# Patient Record
Sex: Female | Born: 1995 | Hispanic: Yes | Marital: Single | Smoking: Former smoker
Health system: Southern US, Community
[De-identification: ages and names within clinical notes are randomized; demographics above are authoritative.]

## PROBLEM LIST (undated history)

## (undated) ENCOUNTER — Inpatient Hospital Stay (HOSPITAL_COMMUNITY): Payer: Self-pay

## (undated) DIAGNOSIS — F419 Anxiety disorder, unspecified: Secondary | ICD-10-CM

## (undated) DIAGNOSIS — B977 Papillomavirus as the cause of diseases classified elsewhere: Secondary | ICD-10-CM

## (undated) DIAGNOSIS — R87629 Unspecified abnormal cytological findings in specimens from vagina: Secondary | ICD-10-CM

## (undated) DIAGNOSIS — O139 Gestational [pregnancy-induced] hypertension without significant proteinuria, unspecified trimester: Secondary | ICD-10-CM

## (undated) HISTORY — PX: NO PAST SURGERIES: SHX2092

## (undated) HISTORY — DX: Gestational (pregnancy-induced) hypertension without significant proteinuria, unspecified trimester: O13.9

## (undated) HISTORY — DX: Unspecified abnormal cytological findings in specimens from vagina: R87.629

---

## 2017-02-21 DIAGNOSIS — B977 Papillomavirus as the cause of diseases classified elsewhere: Secondary | ICD-10-CM

## 2017-02-21 HISTORY — DX: Papillomavirus as the cause of diseases classified elsewhere: B97.7

## 2018-02-21 NOTE — L&D Delivery Note (Addendum)
Patient: Carla Reyes MRN: [id number]  GBS status: negative, IAP given None  Patient is a 23 y.o. now G1P1 s/p NSVD at [redacted]w[redacted]d, who was admitted for IOL 2/2 to [place]. AROM 2h 34m prior to delivery with clear fluid.   Delivery Note At 10:40 PM a viable and healthy female was delivered via Vaginal, Spontaneous (Presentation: ROA; vertex).  APGAR: 9, 9; weight  pending.   Placenta status: spontaneous, intact.  3 vessel cord. With no complications.   Head delivered ROA. On e nuchal cord present. Shoulder and body delivered in usual fashion. Infant with spontaneous cry, placed on mother's abdomen, dried and bulb suctioned. Cord clamped x 2 after 1-minute delay, and cut by family member. Cord blood drawn. Placenta delivered spontaneously with gentle cord traction. Fundus firm with massage and Pitocin. Perineum inspected and found to have superficial perilabial abrasions, which were found to be hemostatic.  Anesthesia:   Episiotomy: None Lacerations: None Suture Repair: None Est. Blood Loss (mL): 300  Mom to postpartum.  Baby to Couplet care / Skin to Skin.  [place] 12/04/2018, 11:07 PM   Attestation:  I confirm that I have verified the information documented in the resident's note and that I have also personally reperformed the physical exam and all medical decision making activities.   I was gloved and present for entire delivery SVD without incident No difficulty with shoulders No lacerations, but there were periurethral abrasions  Seabron Spates, CNM   Please schedule this patient for Postpartum visit in: 4 weeks with the following provider: Any provider For C/S patients schedule nurse incision check in weeks 2 weeks: no High risk pregnancy complicated by: HTN Delivery mode:  SVD Anticipated Birth Control:  IUD  Wants it placed at [facility] visit PP Procedures needed: BP check  1-2 weeks Schedule Integrated [place] visit: no

## 2018-05-04 ENCOUNTER — Other Ambulatory Visit: Payer: Self-pay

## 2018-05-04 ENCOUNTER — Encounter (HOSPITAL_COMMUNITY): Payer: Self-pay | Admitting: *Deleted

## 2018-05-04 ENCOUNTER — Inpatient Hospital Stay (HOSPITAL_COMMUNITY): Payer: Self-pay

## 2018-05-04 ENCOUNTER — Inpatient Hospital Stay (HOSPITAL_COMMUNITY)
Admission: AD | Admit: 2018-05-04 | Discharge: 2018-05-04 | Disposition: A | Discharge status: Home / Self Care | Payer: Self-pay | Attending: Obstetrics and Gynecology | Admitting: Obstetrics and Gynecology

## 2018-05-04 DIAGNOSIS — Z3490 Encounter for supervision of normal pregnancy, unspecified, unspecified trimester: Secondary | ICD-10-CM

## 2018-05-04 DIAGNOSIS — Z3A01 Less than 8 weeks gestation of pregnancy: Secondary | ICD-10-CM

## 2018-05-04 DIAGNOSIS — O26891 Other specified pregnancy related conditions, first trimester: Secondary | ICD-10-CM

## 2018-05-04 DIAGNOSIS — Z87891 Personal history of nicotine dependence: Secondary | ICD-10-CM | POA: Insufficient documentation

## 2018-05-04 DIAGNOSIS — R102 Pelvic and perineal pain: Secondary | ICD-10-CM

## 2018-05-04 DIAGNOSIS — Z3A08 8 weeks gestation of pregnancy: Secondary | ICD-10-CM | POA: Insufficient documentation

## 2018-05-04 HISTORY — DX: Anxiety disorder, unspecified: F41.9

## 2018-05-04 HISTORY — DX: Papillomavirus as the cause of diseases classified elsewhere: B97.7

## 2018-05-04 LAB — CBC
HCT: 39.2 % (ref 36.0–46.0)
HEMOGLOBIN: 12.9 g/dL (ref 12.0–15.0)
MCH: 28.8 pg (ref 26.0–34.0)
MCHC: 32.9 g/dL (ref 30.0–36.0)
MCV: 87.5 fL (ref 80.0–100.0)
Platelets: 269 10*3/uL (ref 150–400)
RBC: 4.48 MIL/uL (ref 3.87–5.11)
RDW: 13.3 % (ref 11.5–15.5)
WBC: 7.5 10*3/uL (ref 4.0–10.5)
nRBC: 0 % (ref 0.0–0.2)

## 2018-05-04 LAB — URINALYSIS, ROUTINE W REFLEX MICROSCOPIC
BILIRUBIN URINE: NEGATIVE
Glucose, UA: NEGATIVE mg/dL
Hgb urine dipstick: NEGATIVE
Ketones, ur: NEGATIVE mg/dL
Leukocytes,Ua: NEGATIVE
Nitrite: NEGATIVE
Protein, ur: NEGATIVE mg/dL
Specific Gravity, Urine: 1.024 (ref 1.005–1.030)
pH: 5 (ref 5.0–8.0)

## 2018-05-04 LAB — ABO/RH: ABO/RH(D): O POS

## 2018-05-04 LAB — HCG, QUANTITATIVE, PREGNANCY: hCG, Beta Chain, Quant, S: 69722 m[IU]/mL — ABNORMAL HIGH (ref <5)

## 2018-05-04 LAB — POCT PREGNANCY, URINE: Preg Test, Ur: POSITIVE — AB

## 2018-05-04 NOTE — Discharge Instructions (Signed)
[facility]     Phone: [phone]  [facility] [place]  Phone: [phone]  [facility] [employer] [facility] [place]  Phone: [phone]  [facility] [employer] [facility] [place]                           Phone: [phone]  [facility] for [facility]          Phone: [phone]  [facility]    Phone: [phone]   [facility] [place])    Phone: [phone]  [facility]    Phone: [phone]  [facility]    Phone: [phone]  [facility]    Phone: [phone]  [facility]  Phone: [phone]  [facility]               Phone: [phone]  [facility]   Phone: [phone]  [facility]    Phone: [phone]                   Abdominal Pain During Pregnancy  Abdominal pain is common during pregnancy, and has many possible causes. Some causes are more serious than others, and sometimes the cause is not known. Abdominal pain can be a sign that labor is starting. It can also be caused by normal growth and stretching of muscles and ligaments during pregnancy. Always tell your health care provider if you have any abdominal pain. Follow these instructions at home:  Do not have sex or put anything in your vagina until your pain goes away completely.  Get plenty of rest until your pain improves.  Drink enough fluid to keep your urine pale yellow.  Take over-the-counter and prescription medicines only as told by your health care provider.  Keep all follow-up visits as told by your health care provider. This is important. Contact a health care provider if:  Your pain continues or gets worse after resting.  You have lower abdominal pain that: ? Comes and  goes at regular intervals. ? Spreads to your back. ? Is similar to menstrual cramps.  You have pain or burning when you urinate. Get help right away if:  You have a fever or chills.  You have vaginal bleeding.  You are leaking fluid from your vagina.  You are passing tissue from your vagina.  You have vomiting or diarrhea that lasts for more than 24 hours.  Your baby is moving less than usual.  You feel very weak or faint.  You have shortness of breath.  You develop severe pain in your upper abdomen. Summary  Abdominal pain is common during pregnancy, and has many possible causes.  If you experience abdominal pain during pregnancy, tell your health care provider right away.  Follow your health care provider's home care instructions and keep all follow-up visits as directed. This information is not intended to replace advice given to you by your health care provider. Make sure you discuss any questions you have with your health care provider. Document Released: 02/07/2005 Document Revised: 05/12/2016 Document Reviewed: 05/12/2016 Elsevier Interactive Patient Education  2019 [employer].  Safe Medications in Pregnancy   Acne: Benzoyl Peroxide Salicylic Acid  Backache/Headache: Tylenol: 2 regular strength every 4 hours OR  2 Extra strength every 6 hours  Colds/Coughs/Allergies: Benadryl (alcohol free) 25 mg every 6 hours as needed Breath right strips Claritin Cepacol throat lozenges Chloraseptic throat spray Cold-Eeze- up to three times per day Cough drops, alcohol free Flonase (by prescription only) Guaifenesin Mucinex Robitussin DM (plain only, alcohol free) Saline nasal spray/drops Sudafed (pseudoephedrine) & Actifed ** use only after [redacted] weeks gestation and if you do not have high blood pressure Tylenol Vicks Vaporub Zinc lozenges Zyrtec   Constipation: Colace Ducolax suppositories Fleet enema Glycerin suppositories Metamucil Milk of  magnesia Miralax Senokot Smooth move tea  Diarrhea: Kaopectate Imodium A-D  *NO pepto Bismol  Hemorrhoids: Anusol Anusol HC Preparation H Tucks  Indigestion: Tums Maalox Mylanta Zantac  Pepcid  Insomnia: Benadryl (alcohol free) 25mg  every 6 hours as needed Tylenol PM Unisom, no Gelcaps  Leg Cramps: Tums MagGel  Nausea/Vomiting:  Bonine Dramamine Emetrol Ginger extract Sea bands Meclizine  Nausea medication to take during pregnancy:  Unisom (doxylamine succinate 25 mg tablets) Take one tablet daily at bedtime. If symptoms are not adequately controlled, the dose can be increased to a maximum recommended dose of two tablets daily (1/2 tablet in the morning, 1/2 tablet mid-afternoon and one at bedtime). Vitamin B6 100mg  tablets. Take one tablet twice a day (up to 200 mg per day).  Skin Rashes: Aveeno products Benadryl cream or 25mg  every 6 hours as needed Calamine Lotion 1% cortisone cream  Yeast infection: Gyne-lotrimin 7 Monistat 7   **If taking multiple medications, please check labels to avoid duplicating the same active ingredients **take medication as directed on the label ** Do not exceed 4000 mg of tylenol in 24 hours **Do not take medications that contain aspirin or ibuprofen

## 2018-05-04 NOTE — MAU Note (Addendum)
Presents stating she was seen @ [facility], had ultrasound Tuesday, March 11th.  Reports called today with U/S results, instructed to go to MAU for further testing d/t seeing "yolk sac & another sac".  Currently denies abdominal pain, but has had pain in the past.  Denies VB.

## 2018-05-04 NOTE — MAU Provider Note (Signed)
History     CSN: [id number]  Arrival date and time: 05/04/18 1218   None     Chief Complaint  Patient presents with  . Ectopic Pregnancy   Ms. Carla Reyes is a 23 y.o. G2P0010 at 6550w2d who presents to MAU for evaluation after she was seen at [facility]. Pt states she received a call from this office after receiving a free US there and was told that they "saw a yolk sac and another sac" of undetermined significance and instructed patient to come to MAU for evaluation. Pt denies VB so far this pregnancy. Pt reports mild pelvic pain/abdominal cramping intermittently for the last several weeks, but denies any pain today. Pt denies any other symptoms and reports the reason she is here today is she was instructed to come here by [facility].  Pt denies vaginal discharge/odor/itching. Pt denies N/V, abdominal pain, constipation, diarrhea, or urinary problems. Problems this pregnancy include: none. Allergies? NKDA Current medications/supplements? none Prenatal care provider? none  Boyfriend present for entire visit.    OB History    Gravida  2   Para      Term      Preterm      AB  1   Living        SAB  1   TAB      Ectopic      Multiple      Live Births              Past Medical History:  Diagnosis Date  . Anxiety   . HPV in female 2019    Past Surgical History:  Procedure Laterality Date  . NO PAST SURGERIES      History reviewed. No pertinent family history.  Social History   Tobacco Use  . Smoking status: Former Smoker    Types: Cigarettes  . Smokeless tobacco: Never Used  Substance Use Topics  . Alcohol use: Not Currently  . Drug use: Not Currently    Allergies: No Known Allergies  No medications prior to admission.    Review of Systems  Constitutional: Negative for chills, diaphoresis, fatigue and fever.  Gastrointestinal: Negative for abdominal pain, constipation, diarrhea, nausea and vomiting.   Genitourinary: Negative for difficulty urinating, dysuria, frequency, pelvic pain, urgency, vaginal bleeding and vaginal discharge.  Neurological: Negative for dizziness, weakness, light-headedness and headaches.   Physical Exam   Blood pressure 126/86, pulse 79, temperature 98.5 F (36.9 C), temperature source Oral, resp. rate 19, height 5\' 3"  (1.6 m), weight 82.3 kg, last menstrual period 03/07/2018, SpO2 97 %.  Physical Exam  Constitutional: She is oriented to person, place, and time. She appears well-developed and well-nourished. No distress.  HENT:  Head: Normocephalic.  Respiratory: Effort normal.  GI: Soft. She exhibits no distension and no mass. There is no abdominal tenderness. There is no rebound and no guarding.  Genitourinary:    Vagina and uterus normal.  Cervix exhibits no motion tenderness, no discharge and no friability. Right adnexum displays no mass, no tenderness and no fullness. Left adnexum displays no mass, no tenderness and no fullness.    No vaginal discharge.   Neurological: She is alert and oriented to person, place, and time.  Skin: Skin is warm and dry. She is not diaphoretic.  Psychiatric: She has a normal mood and affect. Her behavior is normal.   Results for orders placed or performed during the hospital encounter of 05/04/18 (from the past 24 hour(s))  Pregnancy,  urine POC     Status: Abnormal   Collection Time: 05/04/18 12:38 PM  Result Value Ref Range   Preg Test, Ur POSITIVE (A) NEGATIVE  Urinalysis, Routine w reflex microscopic     Status: Abnormal   Collection Time: 05/04/18 12:42 PM  Result Value Ref Range   Color, Urine YELLOW YELLOW   APPearance HAZY (A) CLEAR   Specific Gravity, Urine 1.024 1.005 - 1.030   pH 5.0 5.0 - 8.0   Glucose, UA NEGATIVE NEGATIVE mg/dL   Hgb urine dipstick NEGATIVE NEGATIVE   Bilirubin Urine NEGATIVE NEGATIVE   Ketones, ur NEGATIVE NEGATIVE mg/dL   Protein, ur NEGATIVE NEGATIVE mg/dL   Nitrite NEGATIVE NEGATIVE    Leukocytes,Ua NEGATIVE NEGATIVE  CBC     Status: None   Collection Time: 05/04/18  1:18 PM  Result Value Ref Range   WBC 7.5 4.0 - 10.5 K/uL   RBC 4.48 3.87 - 5.11 MIL/uL   Hemoglobin 12.9 12.0 - 15.0 g/dL   HCT 29.5 62.1 - 30.8 %   MCV 87.5 80.0 - 100.0 fL   MCH 28.8 26.0 - 34.0 pg   MCHC 32.9 30.0 - 36.0 g/dL   RDW 65.7 84.6 - 96.2 %   Platelets 269 150 - 400 K/uL   nRBC 0.0 0.0 - 0.2 %  ABO/Rh     Status: None   Collection Time: 05/04/18  1:18 PM  Result Value Ref Range   ABO/RH(D) O POS    No rh immune globuloin      NOT A RH IMMUNE GLOBULIN CANDIDATE, PT RH POSITIVE Performed at [facility], [address], [place], [place] [address]    US Ob Comp Less 14 Wks  Result Date: 05/04/2018 CLINICAL DATA:  Pelvic pain EXAM: OBSTETRIC <14 WK [place] AND TRANSVAGINAL OB US TECHNIQUE: Both transabdominal and transvaginal ultrasound examinations were performed for complete evaluation of the gestation as well as the maternal uterus, adnexal regions, and pelvic cul-de-sac. Transvaginal technique was performed to assess early pregnancy. COMPARISON:  None. FINDINGS: Intrauterine gestational sac: Visualized Yolk sac:  Visualized Embryo:  Visualized Cardiac Activity: Visualized Heart Rate: 166 bpm CRL:  15 mm   7 w   5 d                  [place] EDC: December 16, 2018 Subchorionic hemorrhage: There is a subchorionic hemorrhage measuring 1.5 x 0.5 x 0.7 cm. Maternal uterus/adnexae: Cervical os is closed. The right ovary measures 3.4 x 2.3 x 2.5 cm. Left ovary measures 2.2 x 1.5 x 2.2 cm. Small corpus luteum noted on the right. No other extrauterine pelvic or adnexal mass. No free pelvic fluid. IMPRESSION: Single live intrauterine gestation with estimated gestational age of 8-weeks. Subchorionic hemorrhage measuring 1.5 x 0.5 x 0.7 cm. No extrauterine pelvic mass beyond physiologic corpus luteum on the right. No free pelvic fluid. Electronically Signed   By: Bretta Bang III M.D.   On: 05/04/2018  13:46    MAU Course  Procedures  MDM -pt here for ectopic evaluation after finding of unknown significance found on [place] at [facility] -UA: hazy, otherwise WNL -hCG: pending at time of discharge -CBC: WNL -ABO: O positive -[place]: single IUP, [redacted]w[redacted]d, +FHB, sm. subchorionic hemorrhage, sm. corpus luteal cyst -pt discharged to home in stable condition with boyfriend and list of area OB providers  Orders Placed This Encounter  Procedures  . US OB Comp Less 14 Wks    Standing Status:  Standing    Number of Occurrences:   1    Order Specific Question:   Symptom/Reason for Exam    Answer:   Pelvic pain [[id number]]  . Urinalysis, Routine w reflex microscopic    Standing Status:   Standing    Number of Occurrences:   1  . CBC    Standing Status:   Standing    Number of Occurrences:   1  . hCG, quantitative, pregnancy    Standing Status:   Standing    Number of Occurrences:   1  . Pregnancy, urine POC    Standing Status:   Standing    Number of Occurrences:   1  . ABO/Rh    Standing Status:   Standing    Number of Occurrences:   1  . Discharge patient    Order Specific Question:   Discharge disposition    Answer:   01-Home or Self Care [1]    Order Specific Question:   Discharge patient date    Answer:   05/04/2018   No orders of the defined types were placed in this encounter.    Assessment and Plan   1. [redacted] weeks gestation of pregnancy   2. Pelvic pain   3. Intrauterine pregnancy    Allergies as of 05/04/2018   No Known Allergies     Medication List    You have not been prescribed any medications.     -begin taking PNVs -list of area OB providers given, please call to arrange new OB appt in 1week -pt discharged to home in stable condition with boyfriend  Odie Sera  05/04/2018, 2:18 PM

## 2018-06-14 ENCOUNTER — Other Ambulatory Visit (HOSPITAL_COMMUNITY)
Admission: RE | Admit: 2018-06-14 | Discharge: 2018-06-14 | Disposition: A | Discharge status: Home / Self Care | Payer: Medicaid Other | Source: Ambulatory Visit | Attending: Family Medicine | Admitting: Family Medicine

## 2018-06-14 ENCOUNTER — Other Ambulatory Visit: Payer: Self-pay

## 2018-06-14 ENCOUNTER — Encounter: Payer: Self-pay | Admitting: Family Medicine

## 2018-06-14 ENCOUNTER — Ambulatory Visit (INDEPENDENT_AMBULATORY_CARE_PROVIDER_SITE_OTHER): Payer: Medicaid Other | Admitting: Family Medicine

## 2018-06-14 DIAGNOSIS — Z3402 Encounter for supervision of normal first pregnancy, second trimester: Secondary | ICD-10-CM | POA: Diagnosis not present

## 2018-06-14 DIAGNOSIS — Z3400 Encounter for supervision of normal first pregnancy, unspecified trimester: Secondary | ICD-10-CM | POA: Insufficient documentation

## 2018-06-14 DIAGNOSIS — Z3A14 14 weeks gestation of pregnancy: Secondary | ICD-10-CM | POA: Diagnosis not present

## 2018-06-14 LAB — POCT URINALYSIS DIPSTICK OB
Bilirubin, UA: NEGATIVE
Blood, UA: NEGATIVE
Glucose, UA: NEGATIVE
Ketones, UA: NEGATIVE
Nitrite, UA: NEGATIVE
Spec Grav, UA: 1.015 (ref 1.010–1.025)
pH, UA: 6 (ref 5.0–8.0)

## 2018-06-14 NOTE — Progress Notes (Signed)
  Subjective:  Carla Reyes is a G2P0010 105w1d being seen today for her first obstetrical visit.  Her obstetrical history is significant for first pregnancy. FOB involved: boyfriend. Patient does intend to breast feed. Pregnancy history fully reviewed.  Patient reports no complaints.  BP 123/79   Pulse 89   Wt 183 lb 1.3 oz (83 kg)   LMP 03/07/2018   BMI 32.43 kg/m   HISTORY: OB History  Gravida Para Term Preterm AB Living  2       1    SAB TAB Ectopic Multiple Live Births  1            # Outcome Date GA Lbr Len/2nd Weight Sex Delivery Anes PTL Lv  2 Current           1 SAB             Past Medical History:  Diagnosis Date  . Anxiety   . HPV in female 2019    Past Surgical History:  Procedure Laterality Date  . NO PAST SURGERIES      Family History  Problem Relation Age of Onset  . Hypertension Father      Exam  BP 123/79   Pulse 89   Wt 183 lb 1.3 oz (83 kg)   LMP 03/07/2018   BMI 32.43 kg/m   CONSTITUTIONAL: Well-developed, well-nourished female in no acute distress.  HENT:  Normocephalic, atraumatic, External right and left ear normal. Oropharynx is clear and moist EYES: Conjunctivae and EOM are normal. Pupils are equal, round, and reactive to light. No scleral icterus.  NECK: Normal range of motion, supple, no masses.  Normal thyroid.  CARDIOVASCULAR: Normal heart rate noted, regular rhythm RESPIRATORY: Clear to auscultation bilaterally. Effort and breath sounds normal, no problems with respiration noted. BREASTS: Symmetric in size. No masses, skin changes, nipple drainage, or lymphadenopathy. ABDOMEN: Soft, normal bowel sounds, no distention noted.  No tenderness, rebound or guarding.  PELVIC: Normal appearing external genitalia; normal appearing vaginal mucosa and cervix. No abnormal discharge noted. Normal uterine size, no other palpable masses, no uterine or adnexal tenderness. MUSCULOSKELETAL: Normal range of motion. No tenderness.  No cyanosis,  clubbing, or edema.  2+ distal pulses. SKIN: Skin is warm and dry. No rash noted. Not diaphoretic. No erythema. No pallor. NEUROLOGIC: Alert and oriented to person, place, and time. Normal reflexes, muscle tone coordination. No cranial nerve deficit noted. PSYCHIATRIC: Normal mood and affect. Normal behavior. Normal judgment and thought content.    Assessment:    Pregnancy: G2P0010 Patient Active Problem List   Diagnosis Date Noted  . Supervision of normal first pregnancy, antepartum 06/14/2018      Plan:   1. Supervision of normal first pregnancy, antepartum FHT and FH normal.  Panorama discussed - desired. Schedule fetal survey [place]  - [facility] AMB BABYSCRIPTS SCHEDULE OPTIMIZATION - Culture, OB Urine - Hemoglobinopathy Evaluation - Obstetric Panel, Including HIV - Cytology - PAP( [place]) - Genetic Screening - US MFM OB COMP + 14 WK; Future - POC Urinalysis Dipstick OB     Problem list reviewed and updated. 75% of 30 min visit spent on counseling and coordination of care.     Levie Heritage 06/14/2018

## 2018-06-16 LAB — CULTURE, OB URINE

## 2018-06-16 LAB — URINE CULTURE, OB REFLEX

## 2018-06-18 LAB — OBSTETRIC PANEL, INCLUDING HIV
Antibody Screen: NEGATIVE
Basophils Absolute: 0 10*3/uL (ref 0.0–0.2)
Basos: 0 %
EOS (ABSOLUTE): 0.1 10*3/uL (ref 0.0–0.4)
Eos: 1 %
HIV Screen 4th Generation wRfx: NONREACTIVE
Hematocrit: 37.1 % (ref 34.0–46.6)
Hemoglobin: 12.9 g/dL (ref 11.1–15.9)
Hepatitis B Surface Ag: NEGATIVE
Immature Grans (Abs): 0.1 10*3/uL (ref 0.0–0.1)
Immature Granulocytes: 1 %
Lymphocytes Absolute: 2.5 10*3/uL (ref 0.7–3.1)
Lymphs: 25 %
MCH: 30 pg (ref 26.6–33.0)
MCHC: 34.8 g/dL (ref 31.5–35.7)
MCV: 86 fL (ref 79–97)
Monocytes Absolute: 0.5 10*3/uL (ref 0.1–0.9)
Monocytes: 5 %
Neutrophils Absolute: 7 10*3/uL (ref 1.4–7.0)
Neutrophils: 68 %
Platelets: 286 10*3/uL (ref 150–450)
RBC: 4.3 x10E6/uL (ref 3.77–5.28)
RDW: 13.6 % (ref 11.7–15.4)
RPR Ser Ql: NONREACTIVE
Rh Factor: POSITIVE
Rubella Antibodies, IGG: 2.58 index (ref >0.99)
WBC: 10.2 10*3/uL (ref 3.4–10.8)

## 2018-06-18 LAB — HEMOGLOBINOPATHY EVALUATION
Ferritin: 35 ng/mL (ref 15–150)
Hgb A2 Quant: 2.4 % (ref 1.8–3.2)
Hgb A: 97.6 % (ref 96.4–98.8)
Hgb C: 0 %
Hgb F Quant: 0 % (ref 0.0–2.0)
Hgb S: 0 %
Hgb Solubility: NEGATIVE
Hgb Variant: 0 %

## 2018-06-20 LAB — CYTOLOGY - PAP
Chlamydia: NEGATIVE
Diagnosis: NEGATIVE
Neisseria Gonorrhea: NEGATIVE

## 2018-06-21 ENCOUNTER — Telehealth: Payer: Self-pay

## 2018-06-21 NOTE — Telephone Encounter (Signed)
Pt called the office requesting Panorama results. Pt made aware that results have not resulted. Understanding was voiced. Jordynn Perrier l Chue Berkovich, CMA

## 2018-06-26 NOTE — Telephone Encounter (Signed)
Patient called and given Panorama results. Armandina Stammer RN

## 2018-07-19 ENCOUNTER — Ambulatory Visit (HOSPITAL_COMMUNITY)
Admission: RE | Admit: 2018-07-19 | Discharge: 2018-07-19 | Disposition: A | Discharge status: Home / Self Care | Payer: Medicaid Other | Source: Ambulatory Visit | Attending: Obstetrics and Gynecology | Admitting: Obstetrics and Gynecology

## 2018-07-19 ENCOUNTER — Other Ambulatory Visit: Payer: Self-pay

## 2018-07-19 ENCOUNTER — Other Ambulatory Visit: Payer: Self-pay | Admitting: Family Medicine

## 2018-07-19 ENCOUNTER — Other Ambulatory Visit (HOSPITAL_COMMUNITY): Payer: Self-pay | Admitting: *Deleted

## 2018-07-19 DIAGNOSIS — Z362 Encounter for other antenatal screening follow-up: Secondary | ICD-10-CM

## 2018-07-19 DIAGNOSIS — Z363 Encounter for antenatal screening for malformations: Secondary | ICD-10-CM | POA: Diagnosis not present

## 2018-07-19 DIAGNOSIS — Z3400 Encounter for supervision of normal first pregnancy, unspecified trimester: Secondary | ICD-10-CM

## 2018-07-19 DIAGNOSIS — O99212 Obesity complicating pregnancy, second trimester: Secondary | ICD-10-CM | POA: Diagnosis not present

## 2018-07-19 DIAGNOSIS — Z3A19 19 weeks gestation of pregnancy: Secondary | ICD-10-CM

## 2018-07-26 ENCOUNTER — Other Ambulatory Visit: Payer: Self-pay

## 2018-07-26 ENCOUNTER — Ambulatory Visit (INDEPENDENT_AMBULATORY_CARE_PROVIDER_SITE_OTHER): Payer: Medicaid Other | Admitting: Family Medicine

## 2018-07-26 DIAGNOSIS — Z3A20 20 weeks gestation of pregnancy: Secondary | ICD-10-CM

## 2018-07-26 DIAGNOSIS — Z3400 Encounter for supervision of normal first pregnancy, unspecified trimester: Secondary | ICD-10-CM

## 2018-07-26 DIAGNOSIS — Z3402 Encounter for supervision of normal first pregnancy, second trimester: Secondary | ICD-10-CM

## 2018-07-26 DIAGNOSIS — R102 Pelvic and perineal pain: Secondary | ICD-10-CM

## 2018-07-26 NOTE — Progress Notes (Signed)
TELEHEALTH OBSTETRICS PRENATAL VIRTUAL VIDEO VISIT ENCOUNTER NOTE  Provider location: [facility]   I connected with Carla Reyes on 07/26/18 at  2:00 PM EDT by WebEx Video Encounter at home and verified that I am speaking with the correct person using two identifiers.   I discussed the limitations, risks, security and privacy concerns of performing an evaluation and management service by telephone and the availability of in person appointments. I also discussed with the patient that there may be a patient responsible charge related to this service. The patient expressed understanding and agreed to proceed. Subjective:  Carla Reyes is a 23 y.o. G2P0010 at [redacted]w[redacted]d being seen today for ongoing prenatal care.  She is currently monitored for the following issues for this low-risk pregnancy and has Supervision of normal first pregnancy, antepartum on their problem list.  Patient reports occasional contractions.  Contractions: Not present. Vag. Bleeding: Small.  Movement: Present. Denies any leaking of fluid.   The following portions of the patient's history were reviewed and updated as appropriate: allergies, current medications, past family history, past medical history, past social history, past surgical history and problem list.   Objective:  There were no vitals filed for this visit.  Fetal Status:     Movement: Present     General:  Alert, oriented and cooperative. Patient is in no acute distress.  Respiratory: Normal respiratory effort, no problems with respiration noted  Mental Status: Normal mood and affect. Normal behavior. Normal judgment and thought content.  Rest of physical exam deferred due to type of encounter  Imaging: [place] Mfm Ob Detail +14 Wk  Result Date: 07/19/2018 ----------------------------------------------------------------------  OBSTETRICS REPORT                       (Signed Final 07/19/2018 09:42 pm)  ---------------------------------------------------------------------- Patient Info  ID #:       [id number]                          D.O.B.:  April 30, 1995 (23 yrs)  Name:       Carla Reyes                   Visit Date: 07/19/2018 12:15 pm ---------------------------------------------------------------------- Performed By  Performed By:     Eden Lathe BS      Ref. Address:      [address]                    [address]                                                              [address]  Attending:        Lin Landsman      [facility]  Referred By:  [facility] ---------------------------------------------------------------------- Orders   #  Description                          Code         Ordered By   1  [place] MFM OB DETAIL +14 WK              [id number]     Candelaria Celeste  ----------------------------------------------------------------------   #  Order #                    Accession #                 Episode #   1  [id number]                  [id number]                  [id number]  ---------------------------------------------------------------------- Indications   Antenatal screening for malformations          Z36.3   Obesity complicating pregnancy, second         O99.212   trimester (pregravid BMI 32)   [redacted] weeks gestation of pregnancy                Z3A.19   Low risk Panorama  ---------------------------------------------------------------------- Fetal Evaluation  Num Of Fetuses:          1  Fetal Heart Rate(bpm):   141  Cardiac Activity:        Observed  Presentation:            Cephalic  Placenta:                Posterior  P. Cord Insertion:       Visualized  Amniotic Fluid  AFI FV:      Within normal limits                              Largest Pocket(cm)                              5.67 ---------------------------------------------------------------------- Biometry  BPD:      45.9  mm     G. Age:  19w 6d          80  %    CI:        75.51   %    70 - 86                                                          FL/HC:       16.1  %    16.1 - 18.3  HC:      167.5  mm     G. Age:  19w 3d         56  %    HC/AC:       1.15       1.09 - 1.39  AC:      145.7  mm     G. Age:  19w 6d         70  %    FL/BPD:  58.8  %  FL:         27  mm     G. Age:  18w 1d         16  %    FL/AC:       18.5  %    20 - 24  HUM:      25.8  mm     G. Age:  18w 0d         22  %  CER:      19.2  mm     G. Age:  18w 4d         35  %  NFT:         3  mm  CM:          7  mm  Est. FW:     278   gm   0 lb 10 oz      46  % ---------------------------------------------------------------------- OB History  Gravidity:    2         Term:   0        Prem:   0        SAB:   1  TOP:          0       Ectopic:  0        Living: 0 ---------------------------------------------------------------------- Gestational Age  LMP:           19w 1d        Date:  03/07/18                 EDD:   12/12/18  U/S Today:     19w 2d                                        EDD:   12/11/18  Best:          19w 1d     Det. By:  LMP  (03/07/18)          EDD:   12/12/18 ---------------------------------------------------------------------- Anatomy  Cranium:               Appears normal         LVOT:                   Appears normal  Cavum:                 Not well visualized    Aortic Arch:            Not well visualized  Ventricles:            Appears normal         Ductal Arch:            Appears normal  Choroid Plexus:        Appears normal         Diaphragm:              Appears normal  Cerebellum:            Appears normal         Stomach:                Appears normal, left  sided  Posterior Fossa:       Appears normal         Abdomen:                Appears normal  Nuchal Fold:           Appears normal         Abdominal Wall:         Appears nml (cord                                                                         insert, abd wall)  Face:                  Appears normal         Cord Vessels:           Appears normal (3                         (orbits and profile)                           vessel cord)  Lips:                  Appears normal         Kidneys:                Appear normal  Palate:                Appears normal         Bladder:                Appears normal  Thoracic:              Appears normal         Spine:                  Not well visualized  Heart:                 Not well visualized    Upper Extremities:      Appears normal  RVOT:                  Appears normal         Lower Extremities:      Appears normal  Other:  Technically difficult due to maternal habitus and fetal position. ---------------------------------------------------------------------- Cervix Uterus Adnexa  Cervix  Length:            3.9  cm.  Normal appearance by transabdominal scan.  Uterus  No abnormality visualized.  Left Ovary  Within normal limits.  Right Ovary  Within normal limits.  Cul De Sac  No free fluid seen.  Adnexa  No abnormality visualized. ---------------------------------------------------------------------- Impression  Normal interval growth.  No ultrasonic evidence of structural  fetal anomalies.  Suboptimal views of the fetal anatomy obtained secondary to  fetal position. ---------------------------------------------------------------------- Recommendations  Follow up anatomy in 4 weeks. ----------------------------------------------------------------------               Lin Landsmanorenthian Booker, MD Electronically Signed Final Report   07/19/2018 09:42 pm ----------------------------------------------------------------------   Assessment  and Plan:  Pregnancy: G2P0010 at [redacted]w[redacted]d  1. Supervision of normal first pregnancy, antepartum Episodic fetal movement  2. Pelvic pain Stretching taught.   Preterm labor symptoms and general obstetric precautions including but not limited to vaginal bleeding,  contractions, leaking of fluid and fetal movement were reviewed in detail with the patient. I discussed the assessment and treatment plan with the patient. The patient was provided an opportunity to ask questions and all were answered. The patient agreed with the plan and demonstrated an understanding of the instructions. The patient was advised to call back or seek an in-person office evaluation/go to [facility] at [facility] for any urgent or concerning symptoms. Please refer to After Visit Summary for other counseling recommendations.   I provided 12 minutes of face-to-face time during this encounter.  No follow-ups on file.  Future Appointments  Date Time Provider Department Center  08/16/2018 11:15 AM [facility] [place] 4 [facility]  08/23/2018  2:00 PM Levie Heritage, DO [facility] None    Levie Heritage, DO Center for [employer], [facility]

## 2018-07-26 NOTE — Progress Notes (Signed)
Patient agrees to webex visit.

## 2018-08-16 ENCOUNTER — Ambulatory Visit (HOSPITAL_COMMUNITY)
Admission: RE | Admit: 2018-08-16 | Discharge: 2018-08-16 | Disposition: A | Discharge status: Home / Self Care | Payer: Medicaid Other | Source: Ambulatory Visit | Attending: Obstetrics and Gynecology | Admitting: Obstetrics and Gynecology

## 2018-08-16 ENCOUNTER — Other Ambulatory Visit (HOSPITAL_COMMUNITY): Payer: Self-pay | Admitting: *Deleted

## 2018-08-16 ENCOUNTER — Other Ambulatory Visit: Payer: Self-pay

## 2018-08-16 DIAGNOSIS — Z362 Encounter for other antenatal screening follow-up: Secondary | ICD-10-CM

## 2018-08-16 DIAGNOSIS — Z3A23 23 weeks gestation of pregnancy: Secondary | ICD-10-CM

## 2018-08-16 DIAGNOSIS — O99212 Obesity complicating pregnancy, second trimester: Secondary | ICD-10-CM

## 2018-08-22 ENCOUNTER — Telehealth: Payer: Self-pay

## 2018-08-22 NOTE — Telephone Encounter (Signed)
Pt called the office stating that she has been having hip and leg pain. Pt states that nothing seems to help. She is also concerned about black stools. Pt states that she had some light bleeding over the weekend after having intercourse.Explained to pt the the cervix is friable in pregnancy and that can cause bleeding after intercourse. Pt is scheduled to come in to be seen on 08/23/18. Kellee Sittner l Khalid Lacko, CMA

## 2018-08-23 ENCOUNTER — Other Ambulatory Visit: Payer: Self-pay

## 2018-08-23 ENCOUNTER — Ambulatory Visit (INDEPENDENT_AMBULATORY_CARE_PROVIDER_SITE_OTHER): Payer: Medicaid Other | Admitting: Family Medicine

## 2018-08-23 ENCOUNTER — Encounter: Payer: Medicaid Other | Admitting: Family Medicine

## 2018-08-23 VITALS — BP 112/70 | HR 87 | Wt 191.0 lb

## 2018-08-23 DIAGNOSIS — R102 Pelvic and perineal pain: Secondary | ICD-10-CM | POA: Diagnosis not present

## 2018-08-23 DIAGNOSIS — M9903 Segmental and somatic dysfunction of lumbar region: Secondary | ICD-10-CM | POA: Diagnosis not present

## 2018-08-23 DIAGNOSIS — Z3402 Encounter for supervision of normal first pregnancy, second trimester: Secondary | ICD-10-CM

## 2018-08-23 DIAGNOSIS — Z3400 Encounter for supervision of normal first pregnancy, unspecified trimester: Secondary | ICD-10-CM

## 2018-08-23 DIAGNOSIS — Z3A24 24 weeks gestation of pregnancy: Secondary | ICD-10-CM

## 2018-08-23 NOTE — Progress Notes (Signed)
   PRENATAL VISIT NOTE  Subjective:  Carla Reyes is a 23 y.o. G2P0010 at [redacted]w[redacted]d being seen today for ongoing prenatal care.  She is currently monitored for the following issues for this low-risk pregnancy and has Supervision of normal first pregnancy, antepartum on their problem list.  Patient reports pain in hips bilaterally and right calf pain. No redness or swelling. Has been stretching, which hasn't improved pain..  Contractions: Not present. Vag. Bleeding: None.  Movement: Present. Denies leaking of fluid.   The following portions of the patient's history were reviewed and updated as appropriate: allergies, current medications, past family history, past medical history, past social history, past surgical history and problem list. Problem list updated.  Objective:   Vitals:   08/23/18 1115  BP: 112/70  Pulse: 87  Weight: 191 lb (86.6 kg)    Fetal Status: Fetal Heart Rate (bpm): 142   Movement: Present     General:  Alert, oriented and cooperative. Patient is in no acute distress.  Skin: Skin is warm and dry. No rash noted.   Cardiovascular: Normal heart rate noted  Respiratory: Normal respiratory effort, no problems with respiration noted  Abdomen: Soft, gravid, appropriate for gestational age. Pain/Pressure: Present     Pelvic:  Cervical exam deferred        MSK: Restriction, tenderness, tissue texture changes, and paraspinal spasm in the lumbar spine  Neuro: Moves all four extremities with no focal neurological deficit  Extremities: Normal range of motion.  Edema: Trace  Mental Status: Normal mood and affect. Normal behavior. Normal judgment and thought content.   OSE: Head   Cervical   Thoracic   Rib   Lumbar L5 ESRL, L1 ESRR  Sacrum L/L  Pelvis Right ant innom    Assessment and Plan:  Pregnancy: G2P0010 at [redacted]w[redacted]d  1. Supervision of normal first pregnancy, antepartum FHT and FH normal. [place] normal  2. Pelvic pain 3. Somatic dysfunction of lumbar region OMT done  after patient permission. HVLA technique utilized. 3 areas treated with improvement of tissue texture and joint mobility. Patient tolerated procedure well.    Preterm labor symptoms and general obstetric precautions including but not limited to vaginal bleeding, contractions, leaking of fluid and fetal movement were reviewed in detail with the patient. Please refer to After Visit Summary for other counseling recommendations.  No follow-ups on file.  Truett Mainland, DO

## 2018-09-14 ENCOUNTER — Ambulatory Visit (HOSPITAL_COMMUNITY)
Admission: RE | Admit: 2018-09-14 | Discharge: 2018-09-14 | Disposition: A | Discharge status: Home / Self Care | Payer: Medicaid Other | Source: Ambulatory Visit | Attending: Obstetrics and Gynecology | Admitting: Obstetrics and Gynecology

## 2018-09-14 ENCOUNTER — Other Ambulatory Visit: Payer: Self-pay

## 2018-09-14 ENCOUNTER — Encounter (HOSPITAL_COMMUNITY): Payer: Self-pay

## 2018-09-14 ENCOUNTER — Ambulatory Visit (HOSPITAL_COMMUNITY): Payer: Medicaid Other | Admitting: *Deleted

## 2018-09-14 DIAGNOSIS — Z3A27 27 weeks gestation of pregnancy: Secondary | ICD-10-CM

## 2018-09-14 DIAGNOSIS — Z362 Encounter for other antenatal screening follow-up: Secondary | ICD-10-CM | POA: Insufficient documentation

## 2018-09-14 DIAGNOSIS — Z3400 Encounter for supervision of normal first pregnancy, unspecified trimester: Secondary | ICD-10-CM | POA: Insufficient documentation

## 2018-09-14 DIAGNOSIS — O99212 Obesity complicating pregnancy, second trimester: Secondary | ICD-10-CM

## 2018-09-20 ENCOUNTER — Other Ambulatory Visit: Payer: Self-pay

## 2018-09-20 ENCOUNTER — Ambulatory Visit (INDEPENDENT_AMBULATORY_CARE_PROVIDER_SITE_OTHER): Payer: Medicaid Other | Admitting: Family Medicine

## 2018-09-20 DIAGNOSIS — Z3400 Encounter for supervision of normal first pregnancy, unspecified trimester: Secondary | ICD-10-CM | POA: Diagnosis not present

## 2018-09-20 DIAGNOSIS — Z3403 Encounter for supervision of normal first pregnancy, third trimester: Secondary | ICD-10-CM

## 2018-09-20 DIAGNOSIS — Z23 Encounter for immunization: Secondary | ICD-10-CM | POA: Diagnosis not present

## 2018-09-20 DIAGNOSIS — Z3A28 28 weeks gestation of pregnancy: Secondary | ICD-10-CM

## 2018-09-20 NOTE — Progress Notes (Signed)
   PRENATAL VISIT NOTE  Subjective:  Carla Reyes is a 23 y.o. G2P0010 at [redacted]w[redacted]d being seen today for ongoing prenatal care.  She is currently monitored for the following issues for this low-risk pregnancy and has Supervision of normal first pregnancy, antepartum on their problem list.  Patient reports no complaints.  Contractions: Irritability. Vag. Bleeding: None.  Movement: Present. Denies leaking of fluid.   The following portions of the patient's history were reviewed and updated as appropriate: allergies, current medications, past family history, past medical history, past social history, past surgical history and problem list.   Objective:   Vitals:   09/20/18 0819  BP: 123/75  Pulse: 86  Weight: 198 lb (89.8 kg)    Fetal Status: Fetal Heart Rate (bpm): 131 Fundal Height: 28 cm Movement: Present     General:  Alert, oriented and cooperative. Patient is in no acute distress.  Skin: Skin is warm and dry. No rash noted.   Cardiovascular: Normal heart rate noted  Respiratory: Normal respiratory effort, no problems with respiration noted  Abdomen: Soft, gravid, appropriate for gestational age.  Pain/Pressure: Present     Pelvic: Cervical exam deferred        Extremities: Normal range of motion.  Edema: Trace  Mental Status: Normal mood and affect. Normal behavior. Normal judgment and thought content.   Assessment and Plan:  Pregnancy: G2P0010 at [redacted]w[redacted]d 1. Supervision of normal first pregnancy, antepartum FHT and FH normal.  - Tdap vaccine greater than or equal to 7yo IM - Glucose Tolerance, 2 Hours w/1 Hour - HIV antibody (with reflex) - CBC - RPR  Preterm labor symptoms and general obstetric precautions including but not limited to vaginal bleeding, contractions, leaking of fluid and fetal movement were reviewed in detail with the patient. Please refer to After Visit Summary for other counseling recommendations.   Return in about 4 weeks (around 10/18/2018) for OB f/u,  Virtual.  No future appointments.  Truett Mainland, DO

## 2018-09-21 LAB — CBC
Hematocrit: 34.4 % (ref 34.0–46.6)
Hemoglobin: 11.1 g/dL (ref 11.1–15.9)
MCH: 28.2 pg (ref 26.6–33.0)
MCHC: 32.3 g/dL (ref 31.5–35.7)
MCV: 88 fL (ref 79–97)
Platelets: 308 10*3/uL (ref 150–450)
RBC: 3.93 x10E6/uL (ref 3.77–5.28)
RDW: 12.4 % (ref 11.7–15.4)
WBC: 10.9 10*3/uL — ABNORMAL HIGH (ref 3.4–10.8)

## 2018-09-21 LAB — GLUCOSE TOLERANCE, 2 HOURS W/ 1HR
Glucose, 1 hour: 167 mg/dL (ref 65–179)
Glucose, 2 hour: 91 mg/dL (ref 65–152)
Glucose, Fasting: 86 mg/dL (ref 65–91)

## 2018-09-21 LAB — RPR: RPR Ser Ql: NONREACTIVE

## 2018-09-21 LAB — HIV ANTIBODY (ROUTINE TESTING W REFLEX): HIV Screen 4th Generation wRfx: NONREACTIVE

## 2018-10-08 ENCOUNTER — Telehealth: Payer: Self-pay

## 2018-10-08 NOTE — Telephone Encounter (Signed)
Patient called and states that she has had some pain that shoots down her inner thigh. Patient is [redacted] weeks pregnant and baby is moving well. Patient appointment moved up to this week with Dr Nehemiah Settle. Kathrene Alu RN

## 2018-10-11 ENCOUNTER — Ambulatory Visit (INDEPENDENT_AMBULATORY_CARE_PROVIDER_SITE_OTHER): Payer: Medicaid Other | Admitting: Family Medicine

## 2018-10-11 ENCOUNTER — Other Ambulatory Visit: Payer: Self-pay

## 2018-10-11 VITALS — BP 126/84 | HR 90 | Wt 202.0 lb

## 2018-10-11 DIAGNOSIS — Z3A31 31 weeks gestation of pregnancy: Secondary | ICD-10-CM

## 2018-10-11 DIAGNOSIS — Z3400 Encounter for supervision of normal first pregnancy, unspecified trimester: Secondary | ICD-10-CM

## 2018-10-11 DIAGNOSIS — Z3403 Encounter for supervision of normal first pregnancy, third trimester: Secondary | ICD-10-CM

## 2018-10-11 NOTE — Progress Notes (Signed)
   PRENATAL VISIT NOTE  Subjective:  Carla Reyes is a 23 y.o. G2P0010 at [redacted]w[redacted]d being seen today for ongoing prenatal care.  She is currently monitored for the following issues for this low-risk pregnancy and has Supervision of normal first pregnancy, antepartum on their problem list.  Patient reports intermittent lower abdominal pain, mainly when in bed laying down. Did have some spotting Wednesday - stopped now. NO contractions, decreased fetal movement, leaking fluid. No recent sex..  Contractions: Irritability. Vag. Bleeding: Small.  Movement: Present. Denies leaking of fluid.   The following portions of the patient's history were reviewed and updated as appropriate: allergies, current medications, past family history, past medical history, past social history, past surgical history and problem list.   Objective:   Vitals:   10/11/18 1038  BP: 126/84  Pulse: 90  Weight: 202 lb (91.6 kg)    Fetal Status: Fetal Heart Rate (bpm): 135 Fundal Height: 31 cm Movement: Present     General:  Alert, oriented and cooperative. Patient is in no acute distress.  Skin: Skin is warm and dry. No rash noted.   Cardiovascular: Normal heart rate noted  Respiratory: Normal respiratory effort, no problems with respiration noted  Abdomen: Soft, gravid, appropriate for gestational age.  Pain/Pressure: Present     Pelvic: Cervical exam deferred        Extremities: Normal range of motion.  Edema: Trace  Mental Status: Normal mood and affect. Normal behavior. Normal judgment and thought content.   Assessment and Plan:  Pregnancy: G2P0010 at [redacted]w[redacted]d 1. Supervision of normal first pregnancy, antepartum FHT and FH normal. Not able to reproduce patient's abdominal pain. Will have patient use abdominal support band to see if helps with pain. Patient to call if worsens or becomes constant.  Preterm labor symptoms and general obstetric precautions including but not limited to vaginal bleeding, contractions,  leaking of fluid and fetal movement were reviewed in detail with the patient. Please refer to After Visit Summary for other counseling recommendations.   Return in about 2 weeks (around 10/25/2018) for OB f/u.  Future Appointments  Date Time Provider [place]  10/25/2018  1:15 PM Truett Mainland, DO [facility] None    Truett Mainland, DO

## 2018-10-11 NOTE — Progress Notes (Signed)
Patient report lower abdominal pain that has increased in the last few days. Patient did have some spotting on Wednesday. Kathrene Alu RN

## 2018-10-19 ENCOUNTER — Encounter (HOSPITAL_COMMUNITY): Payer: Self-pay

## 2018-10-19 ENCOUNTER — Inpatient Hospital Stay (HOSPITAL_COMMUNITY)
Admission: AD | Admit: 2018-10-19 | Discharge: 2018-10-19 | Disposition: A | Discharge status: Home / Self Care | Payer: Medicaid Other | Source: Ambulatory Visit | Attending: Obstetrics and Gynecology | Admitting: Obstetrics and Gynecology

## 2018-10-19 ENCOUNTER — Encounter: Payer: Medicaid Other | Admitting: Family Medicine

## 2018-10-19 ENCOUNTER — Other Ambulatory Visit: Payer: Self-pay

## 2018-10-19 DIAGNOSIS — Z3A32 32 weeks gestation of pregnancy: Secondary | ICD-10-CM | POA: Diagnosis not present

## 2018-10-19 DIAGNOSIS — O368130 Decreased fetal movements, third trimester, not applicable or unspecified: Secondary | ICD-10-CM | POA: Diagnosis not present

## 2018-10-19 DIAGNOSIS — O210 Mild hyperemesis gravidarum: Secondary | ICD-10-CM | POA: Insufficient documentation

## 2018-10-19 DIAGNOSIS — Z3689 Encounter for other specified antenatal screening: Secondary | ICD-10-CM | POA: Diagnosis not present

## 2018-10-19 DIAGNOSIS — Z3400 Encounter for supervision of normal first pregnancy, unspecified trimester: Secondary | ICD-10-CM

## 2018-10-19 DIAGNOSIS — Z87891 Personal history of nicotine dependence: Secondary | ICD-10-CM | POA: Insufficient documentation

## 2018-10-19 MED ORDER — DOXYLAMINE-PYRIDOXINE 10-10 MG PO TBEC: 2.0000 | DELAYED_RELEASE_TABLET | Freq: Every day | ORAL | 1 refills | Status: DC

## 2018-10-19 NOTE — Progress Notes (Signed)
This note also relates to the following rows which could not be included: SpO2 - Cannot attach notes to unvalidated device data  Cordless fetal monitors died, replaced with corded monitors.Marilynne Drivers, RN

## 2018-10-19 NOTE — MAU Note (Signed)
Pt states that since yesterday about 1000 she noticed a decrease in normal fetal movement, she states the movements are less and slower than normal. She also states she felt a sharp pain near her belly button all day yesterday and today has some lower abdominal cramping. Denies bleeding, but has normal discharge she says.

## 2018-10-19 NOTE — Discharge Instructions (Signed)
Fetal Movement Counts Patient Name: ________________________________________________ Patient Due Date: ____________________ What is a fetal movement count?  A fetal movement count is the number of times that you feel your baby move during a certain amount of time. This may also be called a fetal kick count. A fetal movement count is recommended for every pregnant woman. You may be asked to start counting fetal movements as early as week 28 of your pregnancy. Pay attention to when your baby is most active. You may notice your baby's sleep and wake cycles. You may also notice things that make your baby move more. You should do a fetal movement count:  When your baby is normally most active.  At the same time each day. A good time to count movements is while you are resting, after having something to eat and drink. How do I count fetal movements? 1. Find a quiet, comfortable area. Sit, or lie down on your side. 2. Write down the date, the start time and stop time, and the number of movements that you felt between those two times. Take this information with you to your health care visits. 3. For 2 hours, count kicks, flutters, swishes, rolls, and jabs. You should feel at least 10 movements during 2 hours. 4. You may stop counting after you have felt 10 movements. 5. If you do not feel 10 movements in 2 hours, have something to eat and drink. Then, keep resting and counting for 1 hour. If you feel at least 4 movements during that hour, you may stop counting. Contact a health care provider if:  You feel fewer than 4 movements in 2 hours.  Your baby is not moving like he or she usually does. Date: ____________ Start time: ____________ Stop time: ____________ Movements: ____________ Date: ____________ Start time: ____________ Stop time: ____________ Movements: ____________ Date: ____________ Start time: ____________ Stop time: ____________ Movements: ____________ Date: ____________ Start time:  ____________ Stop time: ____________ Movements: ____________ Date: ____________ Start time: ____________ Stop time: ____________ Movements: ____________ Date: ____________ Start time: ____________ Stop time: ____________ Movements: ____________ Date: ____________ Start time: ____________ Stop time: ____________ Movements: ____________ Date: ____________ Start time: ____________ Stop time: ____________ Movements: ____________ Date: ____________ Start time: ____________ Stop time: ____________ Movements: ____________ This information is not intended to replace advice given to you by your health care provider. Make sure you discuss any questions you have with your health care provider. Document Released: 03/09/2006 Document Revised: 02/27/2018 Document Reviewed: 03/19/2015 Elsevier Patient Education  2020 [employer]   Morning Sickness  Morning sickness is when you feel sick to your stomach (nauseous) during pregnancy. You may feel sick to your stomach and throw up (vomit). You may feel sick in the morning, but you can feel this way at any time of day. Some women feel very sick to their stomach and cannot stop throwing up (hyperemesis gravidarum). Follow these instructions at home: Medicines  Take over-the-counter and prescription medicines only as told by your doctor. Do not take any medicines until you talk with your doctor about them first.  Taking multivitamins before getting pregnant can stop or lessen the harshness of morning sickness. Eating and drinking  Eat dry toast or crackers before getting out of bed.  Eat 5 or 6 small meals a day.  Eat dry and bland foods like rice and baked potatoes.  Do not eat greasy, fatty, or spicy foods.  Have someone cook for you if the smell of food causes you to feel sick or throw  up.  If you feel sick to your stomach after taking prenatal vitamins, take them at night or with a snack.  Eat protein when you need a snack. Nuts, yogurt, and  cheese are good choices.  Drink fluids throughout the day.  Try ginger ale made with real ginger, ginger tea made from fresh grated ginger, or ginger candies. General instructions  Do not use any products that have nicotine or tobacco in them, such as cigarettes and e-cigarettes. If you need help quitting, ask your doctor.  Use an air purifier to keep the air in your house free of smells.  Get lots of fresh air.  Try to avoid smells that make you feel sick.  Try: ? Wearing a bracelet that is used for seasickness (acupressure wristband). ? Going to a doctor who puts thin needles into certain body points (acupuncture) to improve how you feel. Contact a doctor if:  You need medicine to feel better.  You feel dizzy or light-headed.  You are losing weight. Get help right away if:  You feel very sick to your stomach and cannot stop throwing up.  You pass out (faint).  You have very bad pain in your belly. Summary  Morning sickness is when you feel sick to your stomach (nauseous) during pregnancy.  You may feel sick in the morning, but you can feel this way at any time of day.  Making some changes to what you eat may help your symptoms go away. This information is not intended to replace advice given to you by your health care provider. Make sure you discuss any questions you have with your health care provider. Document Released: 03/17/2004 Document Revised: 01/20/2017 Document Reviewed: 03/10/2016 Elsevier Patient Education  2020 [employer].

## 2018-10-19 NOTE — Progress Notes (Signed)
This note also relates to the following rows which could not be included: SpO2 - Cannot attach notes to unvalidated device data  Pt turned on side. Marilynne Drivers, RN

## 2018-10-19 NOTE — MAU Provider Note (Signed)
History     CSN: [id number]  Arrival date and time: 10/19/18 1734   First Provider Initiated Contact with Patient 10/19/18 1813      Chief Complaint  Patient presents with  . Decreased Fetal Movement   G2P0010 @32 .2 wks presenting with decreased FM since yesterday. Reports didn't feel FM until 1400 today. Denies VB, LOF, and ctx. Had some pain at umbilicus yesterday but none today. She is eating and drinking well. Pt reports return of morning sickness this week. Had yellow liquid emesis first thing this am before eating.    OB History    Gravida  2   Para      Term      Preterm      AB  1   Living        SAB  1   TAB      Ectopic      Multiple      Live Births              Past Medical History:  Diagnosis Date  . Anxiety   . HPV in female 2019    Past Surgical History:  Procedure Laterality Date  . NO PAST SURGERIES      Family History  Problem Relation Age of Onset  . Hypertension Father     Social History   Tobacco Use  . Smoking status: Former Smoker    Types: Cigarettes  . Smokeless tobacco: Never Used  Substance Use Topics  . Alcohol use: Not Currently  . Drug use: Not Currently    Allergies: No Known Allergies  Medications Prior to Admission  Medication Sig Dispense Refill Last Dose  . Prenatal Vit-Fe Fumarate-FA (PRENATAL VITAMINS PO) Take by mouth.   More than a month at Unknown time    Review of Systems  Gastrointestinal: Negative for abdominal pain.  Genitourinary: Negative for vaginal bleeding and vaginal discharge.   Physical Exam   Blood pressure 118/78, pulse 96, temperature 98.5 F (36.9 C), temperature source Oral, resp. rate 16, height 5\' 3"  (1.6 m), weight 91.6 kg, last menstrual period 03/07/2018, SpO2 97 %.  Physical Exam  Nursing note and vitals reviewed. Constitutional: She is oriented to person, place, and time. She appears well-developed and well-nourished. No distress.  HENT:  Head: Normocephalic and  atraumatic.  Neck: Normal range of motion.  Cardiovascular: Normal rate.  Respiratory: Effort normal. No respiratory distress.  GI: Soft. She exhibits no distension. There is no abdominal tenderness.  gravid  Musculoskeletal: Normal range of motion.  Neurological: She is alert and oriented to person, place, and time.  Skin: Skin is warm and dry.  Psychiatric: She has a normal mood and affect.  EFM: 135 bpm, mod variability, + accels, no decels Toco: none  No results found for this or any previous visit (from the past 24 hour(s)).  MAU Course  Procedures  MDM NST reactive. Pt feeling increased FM since EFM applied. FM audible. Pt reassured. Will Rx Diclegis. Stable for discharge home.   Assessment and Plan   1. 32 week prematurity   2. Supervision of normal first pregnancy, antepartum   3. NST (non-stress test) reactive   4. Decreased fetal movements in third trimester, single or unspecified fetus   5. Morning sickness    Discharge home Follow up at [facility] as scheduled [facility] Rx Dicelgis Maintain good hydration Eat meals and snacks often  Allergies as of 10/19/2018   No Known Allergies     Medication  List    TAKE these medications   Doxylamine-Pyridoxine 10-10 MG Tbec Take 2 tablets by mouth at bedtime. May also take 1 tab in am and 1 tab in afternoon   PRENATAL VITAMINS PO Take by mouth.      Donette LarryMelanie Shondell Fabel, CNM 10/19/2018, 6:20 PM

## 2018-10-25 ENCOUNTER — Encounter: Payer: Self-pay | Admitting: Family Medicine

## 2018-10-25 ENCOUNTER — Ambulatory Visit (INDEPENDENT_AMBULATORY_CARE_PROVIDER_SITE_OTHER): Payer: Medicaid Other | Admitting: Family Medicine

## 2018-10-25 ENCOUNTER — Other Ambulatory Visit: Payer: Self-pay

## 2018-10-25 VITALS — BP 125/87 | HR 92 | Wt 204.0 lb

## 2018-10-25 DIAGNOSIS — Z3403 Encounter for supervision of normal first pregnancy, third trimester: Secondary | ICD-10-CM

## 2018-10-25 DIAGNOSIS — Z23 Encounter for immunization: Secondary | ICD-10-CM | POA: Diagnosis not present

## 2018-10-25 DIAGNOSIS — Z3A33 33 weeks gestation of pregnancy: Secondary | ICD-10-CM

## 2018-10-25 DIAGNOSIS — Z3400 Encounter for supervision of normal first pregnancy, unspecified trimester: Secondary | ICD-10-CM

## 2018-10-25 NOTE — Progress Notes (Signed)
   PRENATAL VISIT NOTE  Subjective:  Carla Reyes is a 23 y.o. G2P0010 at [redacted]w[redacted]d being seen today for ongoing prenatal care.  She is currently monitored for the following issues for this low-risk pregnancy and has Supervision of normal first pregnancy, antepartum on their problem list.  Patient reports no complaints.  Contractions: Not present. Vag. Bleeding: None.  Movement: (!) Decreased. Denies leaking of fluid.   The following portions of the patient's history were reviewed and updated as appropriate: allergies, current medications, past family history, past medical history, past social history, past surgical history and problem list.   Objective:   Vitals:   10/25/18 1321  BP: 125/87  Pulse: 92  Weight: 204 lb (92.5 kg)    Fetal Status: Fetal Heart Rate (bpm): 137 Fundal Height: 33 cm Movement: (!) Decreased     General:  Alert, oriented and cooperative. Patient is in no acute distress.  Skin: Skin is warm and dry. No rash noted.   Cardiovascular: Normal heart rate noted  Respiratory: Normal respiratory effort, no problems with respiration noted  Abdomen: Soft, gravid, appropriate for gestational age.  Pain/Pressure: Present     Pelvic: Cervical exam deferred        Extremities: Normal range of motion.  Edema: None  Mental Status: Normal mood and affect. Normal behavior. Normal judgment and thought content.   Assessment and Plan:  Pregnancy: G2P0010 at [redacted]w[redacted]d 1. Supervision of normal first pregnancy, antepartum FH and FHT. Still feeling regular movements, just a little lighter. Precautions given. - Flu Vaccine QUAD 36+ mos IM  Preterm labor symptoms and general obstetric precautions including but not limited to vaginal bleeding, contractions, leaking of fluid and fetal movement were reviewed in detail with the patient. Please refer to After Visit Summary for other counseling recommendations.   Return in about 2 weeks (around 11/08/2018) for OB f/u.  No future appointments.   Truett Mainland, DO

## 2018-11-05 ENCOUNTER — Telehealth: Payer: Self-pay

## 2018-11-05 NOTE — Telephone Encounter (Signed)
Received a call from [facility] that patient had an elevated BP.   Attempted to reach patient by phone and left message for her to return call to office. Kathrene Alu RN

## 2018-11-08 ENCOUNTER — Ambulatory Visit (INDEPENDENT_AMBULATORY_CARE_PROVIDER_SITE_OTHER): Payer: Medicaid Other | Admitting: Family Medicine

## 2018-11-08 ENCOUNTER — Other Ambulatory Visit: Payer: Self-pay

## 2018-11-08 VITALS — BP 123/88 | HR 98 | Wt 206.0 lb

## 2018-11-08 DIAGNOSIS — Z3400 Encounter for supervision of normal first pregnancy, unspecified trimester: Secondary | ICD-10-CM

## 2018-11-08 DIAGNOSIS — Z3A35 35 weeks gestation of pregnancy: Secondary | ICD-10-CM

## 2018-11-08 DIAGNOSIS — Z3403 Encounter for supervision of normal first pregnancy, third trimester: Secondary | ICD-10-CM

## 2018-11-08 MED ORDER — FAMOTIDINE 20 MG PO TABS: 20.0000 mg | ORAL_TABLET | Freq: Two times a day (BID) | ORAL | 3 refills | Status: DC

## 2018-11-08 NOTE — Progress Notes (Signed)
   PRENATAL VISIT NOTE  Subjective:  Carla Reyes is a 23 y.o. G2P0010 at [redacted]w[redacted]d being seen today for ongoing prenatal care.  She is currently monitored for the following issues for this low-risk pregnancy and has Supervision of normal first pregnancy, antepartum on their problem list.  Patient reports heartburn.  Contractions: Not present. Vag. Bleeding: None.  Movement: Present. Denies leaking of fluid.   The following portions of the patient's history were reviewed and updated as appropriate: allergies, current medications, past family history, past medical history, past social history, past surgical history and problem list.   Objective:   Vitals:   11/08/18 1400  BP: 123/88  Pulse: 98  Weight: 206 lb (93.4 kg)    Fetal Status: Fetal Heart Rate (bpm): 134 Fundal Height: 35 cm Movement: Present  Presentation: Vertex  General:  Alert, oriented and cooperative. Patient is in no acute distress.  Skin: Skin is warm and dry. No rash noted.   Cardiovascular: Normal heart rate noted  Respiratory: Normal respiratory effort, no problems with respiration noted  Abdomen: Soft, gravid, appropriate for gestational age.  Pain/Pressure: Present     Pelvic: Cervical exam deferred        Extremities: Normal range of motion.  Edema: None  Mental Status: Normal mood and affect. Normal behavior. Normal judgment and thought content.   Assessment and Plan:  Pregnancy: G2P0010 at [redacted]w[redacted]d 1. Supervision of normal first pregnancy, antepartum FHT and FH normal. Pepcid of r heartburn.  Preterm labor symptoms and general obstetric precautions including but not limited to vaginal bleeding, contractions, leaking of fluid and fetal movement were reviewed in detail with the patient. Please refer to After Visit Summary for other counseling recommendations.   No follow-ups on file.  Future Appointments  Date Time Provider [place]  11/15/2018  8:15 AM Truett Mainland, DO [facility] None    Truett Mainland, DO

## 2018-11-09 ENCOUNTER — Other Ambulatory Visit: Payer: Self-pay

## 2018-11-09 ENCOUNTER — Inpatient Hospital Stay (HOSPITAL_COMMUNITY)
Admission: AD | Admit: 2018-11-09 | Discharge: 2018-11-09 | Disposition: A | Discharge status: Home / Self Care | Payer: Medicaid Other | Attending: Obstetrics and Gynecology | Admitting: Obstetrics and Gynecology

## 2018-11-09 ENCOUNTER — Encounter (HOSPITAL_COMMUNITY): Payer: Self-pay | Admitting: *Deleted

## 2018-11-09 DIAGNOSIS — F419 Anxiety disorder, unspecified: Secondary | ICD-10-CM | POA: Diagnosis not present

## 2018-11-09 DIAGNOSIS — N949 Unspecified condition associated with female genital organs and menstrual cycle: Secondary | ICD-10-CM

## 2018-11-09 DIAGNOSIS — O99343 Other mental disorders complicating pregnancy, third trimester: Secondary | ICD-10-CM | POA: Diagnosis not present

## 2018-11-09 DIAGNOSIS — Z3A35 35 weeks gestation of pregnancy: Secondary | ICD-10-CM | POA: Diagnosis not present

## 2018-11-09 DIAGNOSIS — O26893 Other specified pregnancy related conditions, third trimester: Secondary | ICD-10-CM | POA: Diagnosis not present

## 2018-11-09 DIAGNOSIS — Z87891 Personal history of nicotine dependence: Secondary | ICD-10-CM | POA: Diagnosis not present

## 2018-11-09 DIAGNOSIS — R102 Pelvic and perineal pain: Secondary | ICD-10-CM | POA: Insufficient documentation

## 2018-11-09 DIAGNOSIS — Z3689 Encounter for other specified antenatal screening: Secondary | ICD-10-CM | POA: Diagnosis not present

## 2018-11-09 LAB — URINALYSIS, ROUTINE W REFLEX MICROSCOPIC
Bacteria, UA: NONE SEEN
Bilirubin Urine: NEGATIVE
Glucose, UA: NEGATIVE mg/dL
Hgb urine dipstick: NEGATIVE
Ketones, ur: NEGATIVE mg/dL
Nitrite: NEGATIVE
Protein, ur: NEGATIVE mg/dL
Specific Gravity, Urine: 1.023 (ref 1.005–1.030)
pH: 6 (ref 5.0–8.0)

## 2018-11-09 LAB — WET PREP, GENITAL
Sperm: NONE SEEN
Trich, Wet Prep: NONE SEEN
Yeast Wet Prep HPF POC: NONE SEEN

## 2018-11-09 MED ORDER — CYCLOBENZAPRINE HCL 10 MG PO TABS
10.0000 mg | ORAL_TABLET | Freq: Once | ORAL | Status: AC
  Administered 2018-11-09: 10 mg via ORAL
  Filled 2018-11-09: qty 1

## 2018-11-09 MED ORDER — ACETAMINOPHEN 325 MG PO TABS
650.0000 mg | ORAL_TABLET | Freq: Once | ORAL | Status: AC
  Administered 2018-11-09: 650 mg via ORAL
  Filled 2018-11-09: qty 2

## 2018-11-09 MED ORDER — CYCLOBENZAPRINE HCL 10 MG PO TABS: 10.0000 mg | ORAL_TABLET | Freq: Two times a day (BID) | ORAL | 0 refills | Status: DC | PRN

## 2018-11-09 MED ORDER — LACTATED RINGERS IV BOLUS
1000.0000 mL | Freq: Once | INTRAVENOUS | Status: AC
  Administered 2018-11-09: 1000 mL via INTRAVENOUS

## 2018-11-09 MED ORDER — ELASTIC BANDAGES & SUPPORTS MISC: 1.0000 | Freq: Every day | 0 refills | Status: DC | PRN

## 2018-11-09 NOTE — Discharge Instructions (Signed)

## 2018-11-09 NOTE — MAU Note (Signed)
.   Carla Reyes is a 23 y.o. at [redacted]w[redacted]d here in MAU reporting: contractions and vaginal pressure that started yesterday around 4 am. Denies any vaginal bleeding or LOF  Onset of complaint: yesterday Pain score: 8 Vitals:   11/09/18 1413  BP: 125/87  Pulse: 96  Resp: 16  Temp: 98 F (36.7 C)  SpO2: 100%     FHT:140 Lab orders placed from triage: UA

## 2018-11-09 NOTE — MAU Provider Note (Signed)
History     CSN: [id number]  Arrival date and time: 11/09/18 1350   First Provider Initiated Contact with Patient 11/09/18 1537     Chief Complaint  Patient presents with  . Contractions   HPI Carla Reyes is a 23 y.o. G2P0010 at [redacted]w[redacted]d who presents to MAU with chief complaint of perineal pressure and concern for preterm contractions. These are new problems, onset yesterday around 4pm. Patient endorses bilateral lower abdominal pressure which she rates as 8/10. Her pain radiates laterally towards her hips and is a 2/10 at rest but her pain spikes with repositioning, walking, and prolonged standing. She has not taken medication or tried other treatments for this complaint. She denies vaginal bleeding, leaking of fluid, decreased fetal movement, fever, falls, or recent illness.    OB History    Gravida  2   Para      Term      Preterm      AB  1   Living        SAB  1   TAB      Ectopic      Multiple      Live Births              Past Medical History:  Diagnosis Date  . Anxiety   . HPV in female 2019    Past Surgical History:  Procedure Laterality Date  . NO PAST SURGERIES      Family History  Problem Relation Age of Onset  . Hypertension Father     Social History   Tobacco Use  . Smoking status: Former Smoker    Types: Cigarettes  . Smokeless tobacco: Never Used  Substance Use Topics  . Alcohol use: Not Currently  . Drug use: Not Currently    Allergies: No Known Allergies  Medications Prior to Admission  Medication Sig Dispense Refill Last Dose  . famotidine (PEPCID) 20 MG tablet Take 1 tablet (20 mg total) by mouth 2 (two) times daily. 60 tablet 3 11/09/2018 at Unknown time  . Prenatal Vit-Fe Fumarate-FA (PRENATAL VITAMINS PO) Take by mouth.   Past Month at Unknown time  . Doxylamine-Pyridoxine 10-10 MG TBEC Take 2 tablets by mouth at bedtime. May also take 1 tab in am and 1 tab in afternoon 100 tablet 1 More than a month at Unknown time     Review of Systems  Constitutional: Negative for chills, fatigue and fever.  Respiratory: Negative for shortness of breath.   Gastrointestinal: Positive for abdominal pain.  Genitourinary: Positive for pelvic pain. Negative for difficulty urinating, dysuria, flank pain, vaginal discharge and vaginal pain.  Musculoskeletal: Negative for back pain.  All other systems reviewed and are negative.  Physical Exam   Blood pressure 125/87, pulse 96, temperature 98 F (36.7 C), resp. rate 16, last menstrual period 03/07/2018, SpO2 100 %.  Physical Exam  Nursing note and vitals reviewed. Constitutional: She is oriented to person, place, and time. She appears well-developed and well-nourished.  Cardiovascular: Normal rate.  Respiratory: Effort normal. No accessory muscle usage. No respiratory distress.  GI: Soft. She exhibits no distension and no mass. There is no abdominal tenderness. There is no rebound, no guarding and no CVA tenderness.  Genitourinary:    Vagina normal.     No vaginal discharge.   Neurological: She is alert and oriented to person, place, and time.  Skin: Skin is warm and dry.  Psychiatric: She has a normal mood and affect. Her behavior is normal.  Judgment and thought content normal.    MAU Course  Procedures  --Cervix visually closed on sterile speculum exam, confirmed with digital exam, FFN not sent --Reactive tracing: baseline 125, moderate variability, positive accels, no decels --Toco: rare contractions, resolving with IV fluid bolus and PO medications given in MAU --Discussed with patient that very low pain score which rises with activities described is hallmark of round ligament pain.   Patient Vitals for the past 24 hrs:  BP Temp Pulse Resp SpO2  11/09/18 1803 123/76 - - 18 -  11/09/18 1413 125/87 98 F (36.7 C) 96 16 100 %   Results for orders placed or performed during the hospital encounter of 11/09/18 (from the past 24 hour(s))  Urinalysis, Routine w  reflex microscopic     Status: Abnormal   Collection Time: 11/09/18  2:11 PM  Result Value Ref Range   Color, Urine YELLOW YELLOW   APPearance HAZY (A) CLEAR   Specific Gravity, Urine 1.023 1.005 - 1.030   pH 6.0 5.0 - 8.0   Glucose, UA NEGATIVE NEGATIVE mg/dL   Hgb urine dipstick NEGATIVE NEGATIVE   Bilirubin Urine NEGATIVE NEGATIVE   Ketones, ur NEGATIVE NEGATIVE mg/dL   Protein, ur NEGATIVE NEGATIVE mg/dL   Nitrite NEGATIVE NEGATIVE   Leukocytes,Ua TRACE (A) NEGATIVE   RBC / HPF 0-5 0 - 5 RBC/hpf   WBC, UA 0-5 0 - 5 WBC/hpf   Bacteria, UA NONE SEEN NONE SEEN   Squamous Epithelial / LPF 0-5 0 - 5   Mucus PRESENT    Amorphous Crystal PRESENT   Wet prep, genital     Status: Abnormal   Collection Time: 11/09/18  3:46 PM   Specimen: Vaginal  Result Value Ref Range   Yeast Wet Prep HPF POC NONE SEEN NONE SEEN   Trich, Wet Prep NONE SEEN NONE SEEN   Clue Cells Wet Prep HPF POC PRESENT (A) NONE SEEN   WBC, Wet Prep HPF POC MANY (A) NONE SEEN   Sperm NONE SEEN    Meds ordered this encounter  Medications  . lactated ringers bolus 1,000 mL  . acetaminophen (TYLENOL) tablet 650 mg  . cyclobenzaprine (FLEXERIL) tablet 10 mg  . cyclobenzaprine (FLEXERIL) 10 MG tablet    Sig: Take 1 tablet (10 mg total) by mouth 2 (two) times daily as needed for muscle spasms.    Dispense:  20 tablet    Refill:  0    Order Specific Question:   Supervising Provider    Answer:   CONSTANT, PEGGY [[id number]]  . Elastic Bandages & Supports MISC    Sig: 1 each by Does not apply route daily as needed.    Dispense:  1 Device    Refill:  0    Order Specific Question:   Supervising Provider    Answer:   CONSTANT, PEGGY [[id number]]   Assessment and Plan  --23 y.o. G2P0010 at 3283w2d  --Reactive tracing --Cervix remains closed 2.5 hours from initial exam --Round ligament pain, rx maternity belt and Flexeril --Discharge home in stable condition  F/U: OB appt 11/15/18  Calvert CantorSamantha C Ruby Logiudice, CNM 11/09/2018, 7:00 PM

## 2018-11-15 ENCOUNTER — Other Ambulatory Visit: Payer: Self-pay | Admitting: Family Medicine

## 2018-11-15 ENCOUNTER — Ambulatory Visit (INDEPENDENT_AMBULATORY_CARE_PROVIDER_SITE_OTHER): Payer: Medicaid Other | Admitting: Family Medicine

## 2018-11-15 ENCOUNTER — Other Ambulatory Visit (HOSPITAL_COMMUNITY)
Admission: RE | Admit: 2018-11-15 | Discharge: 2018-11-15 | Disposition: A | Discharge status: Home / Self Care | Payer: Medicaid Other | Source: Ambulatory Visit | Attending: Family Medicine | Admitting: Family Medicine

## 2018-11-15 ENCOUNTER — Other Ambulatory Visit: Payer: Self-pay

## 2018-11-15 ENCOUNTER — Encounter: Payer: Self-pay | Admitting: Family Medicine

## 2018-11-15 VITALS — BP 123/82 | HR 88 | Wt 208.0 lb

## 2018-11-15 DIAGNOSIS — Z3403 Encounter for supervision of normal first pregnancy, third trimester: Secondary | ICD-10-CM

## 2018-11-15 DIAGNOSIS — Z3400 Encounter for supervision of normal first pregnancy, unspecified trimester: Secondary | ICD-10-CM | POA: Diagnosis not present

## 2018-11-15 DIAGNOSIS — Z3A36 36 weeks gestation of pregnancy: Secondary | ICD-10-CM

## 2018-11-15 NOTE — Progress Notes (Signed)
   PRENATAL VISIT NOTE  Subjective:  Carla Reyes is a 23 y.o. G2P0010 at [redacted]w[redacted]d being seen today for ongoing prenatal care.  She is currently monitored for the following issues for this low-risk pregnancy and has Supervision of normal first pregnancy, antepartum on their problem list.  Patient reports no complaints.  Contractions: Irritability. Vag. Bleeding: None.  Movement: Present. Denies leaking of fluid.   The following portions of the patient's history were reviewed and updated as appropriate: allergies, current medications, past family history, past medical history, past social history, past surgical history and problem list.   Objective:   Vitals:   11/15/18 0824  BP: 123/82  Pulse: 88  Weight: 208 lb (94.3 kg)    Fetal Status:   Fundal Height: 36 cm Movement: Present  Presentation: Vertex  General:  Alert, oriented and cooperative. Patient is in no acute distress.  Skin: Skin is warm and dry. No rash noted.   Cardiovascular: Normal heart rate noted  Respiratory: Normal respiratory effort, no problems with respiration noted  Abdomen: Soft, gravid, appropriate for gestational age.  Pain/Pressure: Present     Pelvic: Cervical exam deferred Dilation: Fingertip Effacement (%): Thick    Extremities: Normal range of motion.  Edema: None  Mental Status: Normal mood and affect. Normal behavior. Normal judgment and thought content.   Assessment and Plan:  Pregnancy: G2P0010 at [redacted]w[redacted]d 1. Supervision of normal first pregnancy, antepartum FHT and FH normal - GC/Chlamydia probe amp ([place])not at [facility] - Culture, beta strep (group b only)  Preterm labor symptoms and general obstetric precautions including but not limited to vaginal bleeding, contractions, leaking of fluid and fetal movement were reviewed in detail with the patient. Please refer to After Visit Summary for other counseling recommendations.   Return in about 1 week (around 11/22/2018) for OB f/u.  No future  appointments.  Truett Mainland, DO

## 2018-11-16 LAB — CERVICOVAGINAL ANCILLARY ONLY
Chlamydia: NEGATIVE
Neisseria Gonorrhea: NEGATIVE

## 2018-11-19 LAB — CULTURE, BETA STREP (GROUP B ONLY): Strep Gp B Culture: NEGATIVE

## 2018-11-21 ENCOUNTER — Ambulatory Visit (INDEPENDENT_AMBULATORY_CARE_PROVIDER_SITE_OTHER): Payer: Medicaid Other | Admitting: Obstetrics and Gynecology

## 2018-11-21 ENCOUNTER — Other Ambulatory Visit: Payer: Self-pay

## 2018-11-21 VITALS — BP 121/82 | HR 84 | Wt 213.0 lb

## 2018-11-21 DIAGNOSIS — Z3400 Encounter for supervision of normal first pregnancy, unspecified trimester: Secondary | ICD-10-CM

## 2018-11-21 DIAGNOSIS — Z3403 Encounter for supervision of normal first pregnancy, third trimester: Secondary | ICD-10-CM

## 2018-11-21 DIAGNOSIS — Z3A37 37 weeks gestation of pregnancy: Secondary | ICD-10-CM

## 2018-11-21 MED ORDER — CYCLOBENZAPRINE HCL 10 MG PO TABS: 10.0000 mg | ORAL_TABLET | Freq: Two times a day (BID) | ORAL | 0 refills | Status: DC | PRN

## 2018-11-21 NOTE — Progress Notes (Signed)
Prenatal Visit Note Date: 11/21/2018 Clinic: [facility]  Subjective:  Carla Reyes is a 23 y.o. G2P0010 at [redacted]w[redacted]d being seen today for ongoing prenatal care.  She is currently monitored for the following issues for this low-risk pregnancy and has Supervision of normal first pregnancy, antepartum on their problem list.  Patient reports no complaints.   Contractions: Irregular. Vag. Bleeding: None.  Movement: Present. Denies leaking of fluid.   The following portions of the patient's history were reviewed and updated as appropriate: allergies, current medications, past family history, past medical history, past social history, past surgical history and problem list. Problem list updated.  Objective:   Vitals:   11/21/18 1337  BP: 121/82  Pulse: 84  Weight: 213 lb (96.6 kg)    Fetal Status: Fetal Heart Rate (bpm): 128 Fundal Height: 38 cm Movement: Present  Presentation: Vertex  General:  Alert, oriented and cooperative. Patient is in no acute distress.  Skin: Skin is warm and dry. No rash noted.   Cardiovascular: Normal heart rate noted  Respiratory: Normal respiratory effort, no problems with respiration noted  Abdomen: Soft, gravid, appropriate for gestational age. Pain/Pressure: Present     Pelvic:  Cervical exam deferred        Extremities: Normal range of motion.  Edema: Trace  Mental Status: Normal mood and affect. Normal behavior. Normal judgment and thought content.   Urinalysis:      Assessment and Plan:  Pregnancy: G2P0010 at [redacted]w[redacted]d  1. Supervision of normal first pregnancy, antepartum Watch weight. Nexplanon. Flexeril refill sent in.  Term labor symptoms and general obstetric precautions including but not limited to vaginal bleeding, contractions, leaking of fluid and fetal movement were reviewed in detail with the patient. Please refer to After Visit Summary for other counseling recommendations.  Return in about 1 week (around  11/28/2018).   Aletha Halim, MD

## 2018-11-29 ENCOUNTER — Telehealth (HOSPITAL_COMMUNITY): Payer: Self-pay | Admitting: *Deleted

## 2018-11-29 ENCOUNTER — Other Ambulatory Visit: Payer: Self-pay

## 2018-11-29 ENCOUNTER — Other Ambulatory Visit (HOSPITAL_COMMUNITY): Payer: Self-pay | Admitting: Advanced Practice Midwife

## 2018-11-29 ENCOUNTER — Ambulatory Visit (HOSPITAL_COMMUNITY)
Admission: RE | Admit: 2018-11-29 | Discharge: 2018-11-29 | Disposition: A | Discharge status: Home / Self Care | Payer: Medicaid Other | Source: Ambulatory Visit | Attending: Certified Nurse Midwife | Admitting: Certified Nurse Midwife

## 2018-11-29 ENCOUNTER — Ambulatory Visit (INDEPENDENT_AMBULATORY_CARE_PROVIDER_SITE_OTHER): Payer: Medicaid Other | Admitting: Certified Nurse Midwife

## 2018-11-29 ENCOUNTER — Encounter (HOSPITAL_COMMUNITY): Payer: Self-pay | Admitting: *Deleted

## 2018-11-29 VITALS — BP 132/92 | HR 85 | Wt 212.0 lb

## 2018-11-29 DIAGNOSIS — O163 Unspecified maternal hypertension, third trimester: Secondary | ICD-10-CM

## 2018-11-29 DIAGNOSIS — Z362 Encounter for other antenatal screening follow-up: Secondary | ICD-10-CM | POA: Diagnosis not present

## 2018-11-29 DIAGNOSIS — Z3A38 38 weeks gestation of pregnancy: Secondary | ICD-10-CM

## 2018-11-29 DIAGNOSIS — O3663X0 Maternal care for excessive fetal growth, third trimester, not applicable or unspecified: Secondary | ICD-10-CM | POA: Diagnosis not present

## 2018-11-29 DIAGNOSIS — O99213 Obesity complicating pregnancy, third trimester: Secondary | ICD-10-CM

## 2018-11-29 DIAGNOSIS — Z3400 Encounter for supervision of normal first pregnancy, unspecified trimester: Secondary | ICD-10-CM

## 2018-11-29 DIAGNOSIS — O169 Unspecified maternal hypertension, unspecified trimester: Secondary | ICD-10-CM | POA: Insufficient documentation

## 2018-11-29 NOTE — Progress Notes (Signed)
Subjective:  Carla Reyes is a 23 y.o. G2P0010 at 78w1dbeing seen today for ongoing prenatal care.  She is currently monitored for the following issues for this low-risk pregnancy and has Supervision of normal first pregnancy, antepartum and Hypertension affecting pregnancy on their problem list.  Patient reports no complaints.  Contractions: Irritability. Vag. Bleeding: None.  Movement: Present. Denies leaking of fluid.   The following portions of the patient's history were reviewed and updated as appropriate: allergies, current medications, past family history, past medical history, past social history, past surgical history and problem list. Problem list updated.  Objective:   Vitals:   11/29/18 1030 11/29/18 1139  BP: 139/87 (!) 132/92  Pulse: 85   Weight: 212 lb (96.2 kg)     Fetal Status: Fetal Heart Rate (bpm): 138 Fundal Height: 41 cm Movement: Present     General:  Alert, oriented and cooperative. Patient is in no acute distress.  Skin: Skin is warm and dry. No rash noted.   Cardiovascular: Normal heart rate noted  Respiratory: Normal respiratory effort, no problems with respiration noted  Abdomen: Soft, gravid, appropriate for gestational age. Pain/Pressure: Present     Pelvic: Vag. Bleeding: None Vag D/C Character: Thin   Cervical exam deferred        Extremities: Normal range of motion.  Edema: Trace  Mental Status: Normal mood and affect. Normal behavior. Normal judgment and thought content.   Urinalysis:      Assessment and Plan:  Pregnancy: G2P0010 at 347w1d1. Supervision of normal first pregnancy, antepartum - s>d, suspect LGA, EFW by [place] 9 lbs - MFM USKoreardered  2. Hypertension affecting pregnancy in third trimester - BP elevated today; a few elevations noted in first trimester, and 1 elevation pre-pregnancy, and 1 elevation 3 wks ago - pt denies hx but I suspect she has CHTN - US[place]FM FETAL BPP WO NON STRESS; Future - [place]FM OB FOLLOW UP; Future -  CBC - Comp Met (CMET) - Protein / creatinine ratio, urine  Term labor symptoms and general obstetric precautions including but not limited to vaginal bleeding, contractions, leaking of fluid and fetal movement were reviewed in detail with the patient. Please refer to After Visit Summary for other counseling recommendations.  Return in about 1 week (around 12/06/2018).   BhJulianne HandlerCNM

## 2018-11-29 NOTE — Telephone Encounter (Signed)
Preadmission screen  

## 2018-11-30 LAB — CBC
Hematocrit: 35.7 % (ref 34.0–46.6)
Hemoglobin: 11.5 g/dL (ref 11.1–15.9)
MCH: 25.5 pg — ABNORMAL LOW (ref 26.6–33.0)
MCHC: 32.2 g/dL (ref 31.5–35.7)
MCV: 79 fL (ref 79–97)
Platelets: 295 10*3/uL (ref 150–450)
RBC: 4.51 x10E6/uL (ref 3.77–5.28)
RDW: 14.5 % (ref 11.7–15.4)
WBC: 9.6 10*3/uL (ref 3.4–10.8)

## 2018-11-30 LAB — COMPREHENSIVE METABOLIC PANEL
ALT: 11 IU/L (ref 0–32)
AST: 17 IU/L (ref 0–40)
Albumin/Globulin Ratio: 1.1 — ABNORMAL LOW (ref 1.2–2.2)
Albumin: 3.6 g/dL — ABNORMAL LOW (ref 3.9–5.0)
Alkaline Phosphatase: 246 IU/L — ABNORMAL HIGH (ref 39–117)
BUN/Creatinine Ratio: 18 (ref 9–23)
BUN: 9 mg/dL (ref 6–20)
Bilirubin Total: <0.2 mg/dL (ref 0.0–1.2)
CO2: 18 mmol/L — ABNORMAL LOW (ref 20–29)
Calcium: 9 mg/dL (ref 8.7–10.2)
Chloride: 105 mmol/L (ref 96–106)
Creatinine, Ser: 0.51 mg/dL — ABNORMAL LOW (ref 0.57–1.00)
GFR calc Af Amer: 157 mL/min/{1.73_m2} (ref >59)
GFR calc non Af Amer: 136 mL/min/{1.73_m2} (ref >59)
Globulin, Total: 3.2 g/dL (ref 1.5–4.5)
Glucose: 83 mg/dL (ref 65–99)
Potassium: 4.2 mmol/L (ref 3.5–5.2)
Sodium: 137 mmol/L (ref 134–144)
Total Protein: 6.8 g/dL (ref 6.0–8.5)

## 2018-12-02 ENCOUNTER — Other Ambulatory Visit: Payer: Self-pay

## 2018-12-03 ENCOUNTER — Other Ambulatory Visit (HOSPITAL_COMMUNITY)
Admission: RE | Admit: 2018-12-03 | Discharge: 2018-12-03 | Disposition: A | Discharge status: Home / Self Care | Payer: Medicaid Other | Source: Ambulatory Visit | Attending: Family Medicine | Admitting: Family Medicine

## 2018-12-03 ENCOUNTER — Inpatient Hospital Stay (HOSPITAL_COMMUNITY)
Admission: AD | Admit: 2018-12-03 | Discharge: 2018-12-06 | DRG: 807 | Disposition: A | Discharge status: Home / Self Care | Payer: Medicaid Other | Attending: Family Medicine | Admitting: Family Medicine

## 2018-12-03 ENCOUNTER — Telehealth: Payer: Self-pay | Admitting: Obstetrics & Gynecology

## 2018-12-03 ENCOUNTER — Encounter (HOSPITAL_COMMUNITY): Payer: Self-pay

## 2018-12-03 ENCOUNTER — Other Ambulatory Visit: Payer: Self-pay

## 2018-12-03 DIAGNOSIS — Z20828 Contact with and (suspected) exposure to other viral communicable diseases: Secondary | ICD-10-CM | POA: Diagnosis present

## 2018-12-03 DIAGNOSIS — O169 Unspecified maternal hypertension, unspecified trimester: Secondary | ICD-10-CM | POA: Diagnosis present

## 2018-12-03 DIAGNOSIS — Z3A38 38 weeks gestation of pregnancy: Secondary | ICD-10-CM

## 2018-12-03 DIAGNOSIS — E669 Obesity, unspecified: Secondary | ICD-10-CM | POA: Diagnosis present

## 2018-12-03 DIAGNOSIS — Z3400 Encounter for supervision of normal first pregnancy, unspecified trimester: Secondary | ICD-10-CM

## 2018-12-03 DIAGNOSIS — O134 Gestational [pregnancy-induced] hypertension without significant proteinuria, complicating childbirth: Secondary | ICD-10-CM | POA: Diagnosis not present

## 2018-12-03 DIAGNOSIS — Z87891 Personal history of nicotine dependence: Secondary | ICD-10-CM

## 2018-12-03 DIAGNOSIS — O163 Unspecified maternal hypertension, third trimester: Secondary | ICD-10-CM

## 2018-12-03 DIAGNOSIS — O99214 Obesity complicating childbirth: Secondary | ICD-10-CM | POA: Diagnosis present

## 2018-12-03 DIAGNOSIS — O133 Gestational [pregnancy-induced] hypertension without significant proteinuria, third trimester: Secondary | ICD-10-CM | POA: Diagnosis not present

## 2018-12-03 LAB — COMPREHENSIVE METABOLIC PANEL
ALT: 13 U/L (ref 0–44)
AST: 20 U/L (ref 15–41)
Albumin: 2.7 g/dL — ABNORMAL LOW (ref 3.5–5.0)
Alkaline Phosphatase: 214 U/L — ABNORMAL HIGH (ref 38–126)
Anion gap: 10 (ref 5–15)
BUN: 9 mg/dL (ref 6–20)
CO2: 21 mmol/L — ABNORMAL LOW (ref 22–32)
Calcium: 8.3 mg/dL — ABNORMAL LOW (ref 8.9–10.3)
Chloride: 106 mmol/L (ref 98–111)
Creatinine, Ser: 0.48 mg/dL (ref 0.44–1.00)
GFR calc Af Amer: >60 mL/min (ref >60)
GFR calc non Af Amer: >60 mL/min (ref >60)
Glucose, Bld: 82 mg/dL (ref 70–99)
Potassium: 3.6 mmol/L (ref 3.5–5.1)
Sodium: 137 mmol/L (ref 135–145)
Total Bilirubin: 0.3 mg/dL (ref 0.3–1.2)
Total Protein: 6.7 g/dL (ref 6.5–8.1)

## 2018-12-03 LAB — URINALYSIS, ROUTINE W REFLEX MICROSCOPIC
Bacteria, UA: NONE SEEN
Bilirubin Urine: NEGATIVE
Glucose, UA: NEGATIVE mg/dL
Hgb urine dipstick: NEGATIVE
Ketones, ur: NEGATIVE mg/dL
Nitrite: NEGATIVE
Protein, ur: NEGATIVE mg/dL
Specific Gravity, Urine: 1.012 (ref 1.005–1.030)
pH: 6 (ref 5.0–8.0)

## 2018-12-03 LAB — TYPE AND SCREEN
ABO/RH(D): O POS
Antibody Screen: NEGATIVE

## 2018-12-03 LAB — CBC
HCT: 33.3 % — ABNORMAL LOW (ref 36.0–46.0)
Hemoglobin: 10.8 g/dL — ABNORMAL LOW (ref 12.0–15.0)
MCH: 25.4 pg — ABNORMAL LOW (ref 26.0–34.0)
MCHC: 32.4 g/dL (ref 30.0–36.0)
MCV: 78.4 fL — ABNORMAL LOW (ref 80.0–100.0)
Platelets: 290 10*3/uL (ref 150–400)
RBC: 4.25 MIL/uL (ref 3.87–5.11)
RDW: 14.6 % (ref 11.5–15.5)
WBC: 9.7 10*3/uL (ref 4.0–10.5)
nRBC: 0 % (ref 0.0–0.2)

## 2018-12-03 LAB — PROTEIN / CREATININE RATIO, URINE
Creatinine, Urine: 69.1 mg/dL
Protein Creatinine Ratio: 0.17 mg/mg{Cre} — ABNORMAL HIGH (ref 0.00–0.15)
Total Protein, Urine: 12 mg/dL

## 2018-12-03 LAB — ABO/RH: ABO/RH(D): O POS

## 2018-12-03 LAB — RPR: RPR Ser Ql: NONREACTIVE

## 2018-12-03 LAB — SARS CORONAVIRUS 2 BY RT PCR (HOSPITAL ORDER, PERFORMED IN ~~LOC~~ HOSPITAL LAB): SARS Coronavirus 2: NEGATIVE

## 2018-12-03 MED ORDER — ONDANSETRON HCL 4 MG/2ML IJ SOLN: 4.0000 mg | Freq: Four times a day (QID) | INTRAMUSCULAR | Status: DC | PRN

## 2018-12-03 MED ORDER — ACETAMINOPHEN 325 MG PO TABS: 650.0000 mg | ORAL_TABLET | ORAL | Status: DC | PRN

## 2018-12-03 MED ORDER — MISOPROSTOL 50MCG HALF TABLET
50.0000 ug | ORAL_TABLET | ORAL | Status: DC | PRN
  Administered 2018-12-03 (×3): 50 ug via ORAL
  Filled 2018-12-03 (×3): qty 1

## 2018-12-03 MED ORDER — TERBUTALINE SULFATE 1 MG/ML IJ SOLN: 0.2500 mg | Freq: Once | INTRAMUSCULAR | Status: DC | PRN

## 2018-12-03 MED ORDER — OXYTOCIN 40 UNITS IN NORMAL SALINE INFUSION - SIMPLE MED
2.5000 [IU]/h | INTRAVENOUS | Status: DC
  Filled 2018-12-03: qty 1000

## 2018-12-03 MED ORDER — LEVONORGESTREL 19.5 MCG/DAY IU IUD: INTRAUTERINE_SYSTEM | Freq: Once | INTRAUTERINE | Status: DC

## 2018-12-03 MED ORDER — BUTALBITAL-APAP-CAFFEINE 50-325-40 MG PO TABS
1.0000 | ORAL_TABLET | Freq: Once | ORAL | Status: AC
  Administered 2018-12-03: 11:00:00 1 via ORAL
  Filled 2018-12-03: qty 1

## 2018-12-03 MED ORDER — LACTATED RINGERS IV SOLN
500.0000 mL | INTRAVENOUS | Status: DC | PRN
  Administered 2018-12-04: 19:00:00 500 mL via INTRAVENOUS
  Administered 2018-12-04: 01:00:00 250 mL via INTRAVENOUS

## 2018-12-03 MED ORDER — BUTALBITAL-APAP-CAFFEINE 50-325-40 MG PO TABS
1.0000 | ORAL_TABLET | Freq: Once | ORAL | Status: AC
  Administered 2018-12-03: 1 via ORAL
  Filled 2018-12-03: qty 1

## 2018-12-03 MED ORDER — LACTATED RINGERS IV SOLN
INTRAVENOUS | Status: DC
  Administered 2018-12-03 – 2018-12-04 (×6): via INTRAVENOUS

## 2018-12-03 MED ORDER — SOD CITRATE-CITRIC ACID 500-334 MG/5ML PO SOLN: 30.0000 mL | ORAL | Status: DC | PRN

## 2018-12-03 MED ORDER — OXYCODONE-ACETAMINOPHEN 5-325 MG PO TABS: 1.0000 | ORAL_TABLET | ORAL | Status: DC | PRN

## 2018-12-03 MED ORDER — FENTANYL CITRATE (PF) 100 MCG/2ML IJ SOLN
100.0000 ug | INTRAMUSCULAR | Status: DC | PRN
  Administered 2018-12-03 (×2): 100 ug via INTRAVENOUS
  Filled 2018-12-03 (×2): qty 2

## 2018-12-03 MED ORDER — LIDOCAINE HCL (PF) 1 % IJ SOLN: 30.0000 mL | INTRAMUSCULAR | Status: DC | PRN

## 2018-12-03 MED ORDER — OXYCODONE-ACETAMINOPHEN 5-325 MG PO TABS: 2.0000 | ORAL_TABLET | ORAL | Status: DC | PRN

## 2018-12-03 MED ORDER — OXYTOCIN BOLUS FROM INFUSION
500.0000 mL | Freq: Once | INTRAVENOUS | Status: AC
  Administered 2018-12-04: 23:00:00 500 mL via INTRAVENOUS

## 2018-12-03 NOTE — Progress Notes (Addendum)
LABOR PROGRESS NOTE  Carla Reyes is a 23 y.o. G2P0010 at [redacted]w[redacted]d  admitted for induction of labor because of gestational hypertension  Subjective: Patient is doing well.  She is not feeling contractions.  Her headache is resolved at this time.  Objective: BP 129/79   Pulse 79   Temp 98.4 F (36.9 C) (Oral)   Resp 18   Ht 5\' 3"  (1.6 m)   Wt 97.1 kg   LMP 03/07/2018   SpO2 100%   BMI 37.92 kg/m  or  Vitals:   12/03/18 1226 12/03/18 1330 12/03/18 1432 12/03/18 1540  BP: (!) 130/93 130/85 107/67 129/79  Pulse: 76 88 88 79  Resp: 18 18  18   Temp:      TempSrc:      SpO2:      Weight:      Height:       Dilation: Fingertip Effacement (%): 50 Cervical Position: Posterior Station: -3 Presentation: Vertex Exam by:: Colman Cater CNM FHT: baseline rate 120, moderate varibility, + acel, no decel Toco: irregular   Labs: Lab Results  Component Value Date   WBC 9.7 12/03/2018   HGB 10.8 (L) 12/03/2018   HCT 33.3 (L) 12/03/2018   MCV 78.4 (L) 12/03/2018   PLT 290 12/03/2018    Patient Active Problem List   Diagnosis Date Noted  . Hypertension affecting pregnancy 11/29/2018  . Supervision of normal first pregnancy, antepartum 06/14/2018    Assessment / Plan: 23 y.o. G2P0010 at [redacted]w[redacted]d here for induction of labor due to gestational hypertension.  Labor: Progressing, Foley bulb in place.  Patient tolerated well but requested pain medications after placement.  IV fentanyl ordered.  Will monitor for Foley bulb movement Fetal Wellbeing: Category 1, reassuring Pain Control: IV fentanyl at this time.  Will request epidural at some point Anticipated MOD: Vaginal delivery  Headache - Has resolved at this time.  Monitor for returning symptoms.  Gifford Shave, MD  PGY-1, [facility]  12/03/2018, 4:13 PM

## 2018-12-03 NOTE — MAU Note (Signed)
.   Carla Reyes is a 23 y.o. at [redacted]w[redacted]d here in MAU reporting: an increase in blood pressure with a headache. Has not taken any medications for her B/P  LMP:  Onset of complaint: 0500 today  Pain score: 7 Vitals:   12/03/18 0851 12/03/18 0853  BP: 137/89   Pulse: 82   Resp: 18   Temp: 98.4 F (36.9 C)   SpO2:  100%     FHT:132 Lab orders placed from triage: UA

## 2018-12-03 NOTE — Progress Notes (Signed)
Labor Progress Note Carla Reyes is a 23 y.o. G2P0010 at [redacted]w[redacted]d presented for IOL for gHTN  S:  Pain improved, feels better after voiding. No c/o.   O:  BP 120/86   Pulse 87   Temp 98.5 F (36.9 C) (Oral)   Resp 18   Ht 5\' 3"  (1.6 m)   Wt 97.1 kg   LMP 03/07/2018   SpO2 100%   BMI 37.92 kg/m  EFM: baseline 115 bpm/ mod variability/ + accels/ no decels  Toco: rare SVE: deferred  A/P: 23 y.o. G2P0010 105w5d  1. Labor: latent 2. FWB: Cat I 3. Pain: analgesia/anesthesia prn 4. gHTN- stable  Foley still in place. Continue Cytotec. Pitocin once foley out. Anticipate labor progression and SVD.  Julianne Handler, CNM 7:37 PM

## 2018-12-03 NOTE — Progress Notes (Addendum)
Carla Reyes is a 23 y.o. G2P0010 at [redacted]w[redacted]d admitted for induction of labor due to gestational Hypertension.  Subjective: Still uncomfortable with Foley Bulb. Feeling baby move, denies LOF or vaginal bleeding.  Objective: BP 124/81   Pulse 84   Temp 98.5 F (36.9 C) (Oral)   Resp 19   Ht 5\' 3"  (1.6 m)   Wt 97.1 kg   LMP 03/07/2018   SpO2 99%   BMI 37.92 kg/m  No intake/output data recorded.  FHT:  FHR: 125 bpm, variability: moderate,  accelerations:  Present,  decelerations:  Absent UC:   irregular, every 2-5 minutes  SVE:   Dilation: Fingertip Effacement (%): 50 Station: -3 Exam by:: Colman Cater CNM   Labs: Lab Results  Component Value Date   WBC 9.7 12/03/2018   HGB 10.8 (L) 12/03/2018   HCT 33.3 (L) 12/03/2018   MCV 78.4 (L) 12/03/2018   PLT 290 12/03/2018    Assessment / Plan: 23 yo G1P0 at 1.5 EGA here for IOL 2/2 gHTN.   Labor: Cyto x3, position confirmed with Leopold's. FB still in place. Plan to recheck once FB is out, AROM and Pitocin as appropriate. Fetal Wellbeing:  Category I Pain Control:  Epidural and IV pain meds I/D:  Negative Anticipated MOD:  vaginal delivery, CS as appropriate  MOC: Requests post-placental IUD. Reviewed risks/benefits, consent signed and Liletta ordered.  Merilyn Baba DO OB Fellow, Faculty Practice 12/03/2018, 9:29 PM

## 2018-12-03 NOTE — H&P (Addendum)
LABOR AND DELIVERY ADMISSION HISTORY AND PHYSICAL NOTE  Devetta Hagenow is a 23 y.o. female G2P0010 with IUP at [redacted]w[redacted]d by LMP presenting for induction of labor because of gestational hypertension.  Patient reports that she had a headache today that did not resolve with medical treatment. Upon arrival to the MAU she received a dose of Fioricet.  Her headache went from a 7 on the pain scale to a 6 on a pain scale.  Patient also notes she had issues with nausea last Friday and has noticed increased urination since Saturday 12/01/2018.   Patient denies changes in vision, seeing spots, chest pain, shortness of breath, diarrhea, constipation.  She denies hematuria.  She reports positive fetal movement. She denies leakage of fluid or vaginal bleeding.  Prenatal History/Complications: - Gestational hypertension - obesity  Past Medical History: Past Medical History:  Diagnosis Date  . Anxiety   . HPV in female 2019  . Pregnancy induced hypertension   . Vaginal Pap smear, abnormal     Past Surgical History: Past Surgical History:  Procedure Laterality Date  . NO PAST SURGERIES      Obstetrical History: OB History    Gravida  2   Para      Term      Preterm      AB  1   Living        SAB  1   TAB      Ectopic      Multiple      Live Births              Social History: Social History   Socioeconomic History  . Marital status: Single    Spouse name: Not on file  . Number of children: Not on file  . Years of education: Not on file  . Highest education level: Not on file  Occupational History  . Not on file  Social Needs  . Financial resource strain: Not on file  . Food insecurity    Worry: Not on file    Inability: Not on file  . Transportation needs    Medical: Not on file    Non-medical: Not on file  Tobacco Use  . Smoking status: Former Smoker    Types: Cigarettes    Quit date: 2019    Years since quitting: 1.7  . Smokeless tobacco: Never Used   Substance and Sexual Activity  . Alcohol use: Not Currently  . Drug use: Not Currently  . Sexual activity: Yes  Lifestyle  . Physical activity    Days per week: Not on file    Minutes per session: Not on file  . Stress: Not on file  Relationships  . Social Herbalist on phone: Not on file    Gets together: Not on file    Attends religious service: Not on file    Active member of club or organization: Not on file    Attends meetings of clubs or organizations: Not on file    Relationship status: Not on file  Other Topics Concern  . Not on file  Social History Narrative  . Not on file    Family History: Family History  Problem Relation Age of Onset  . Hypertension Father   . Diabetes Maternal Grandfather   . Diabetes Paternal Grandmother   . Diabetes Paternal Grandfather   . Arthritis Paternal Grandfather     Allergies: No Known Allergies  Medications Prior to Admission  Medication Sig  Dispense Refill Last Dose  . cyclobenzaprine (FLEXERIL) 10 MG tablet Take 1 tablet (10 mg total) by mouth 2 (two) times daily as needed for muscle spasms. 30 tablet 0   . Doxylamine-Pyridoxine 10-10 MG TBEC Take 2 tablets by mouth at bedtime. May also take 1 tab in am and 1 tab in afternoon (Patient not taking: Reported on 11/21/2018) 100 tablet 1   . Elastic Bandages & Supports MISC 1 each by Does not apply route daily as needed. (Patient not taking: Reported on 11/21/2018) 1 Device 0   . famotidine (PEPCID) 20 MG tablet Take 1 tablet (20 mg total) by mouth 2 (two) times daily. 60 tablet 3   . Prenatal Vit-Fe Fumarate-FA (PRENATAL VITAMINS PO) Take by mouth.        Review of Systems   All systems reviewed and negative except as stated in HPI  Blood pressure 135/88, pulse 76, temperature 98.4 F (36.9 C), temperature source Oral, resp. rate 18, height 5\' 3"  (1.6 m), weight 97.1 kg, last menstrual period 03/07/2018, SpO2 100 %. General appearance: alert, cooperative and no  distress Lungs: clear to auscultation bilaterally Heart: regular rate and rhythm, no murmurs appreciated Abdomen: soft, non-tender; bowel sounds normal Extremities: No calf swelling or tenderness Presentation: cephalic by Leopold's and bedside ultrasound Fetal monitoring: Moderate variability, baseline- 135 bpm, + accels, no decels Uterine activity: irregular  Dilation: Fingertip Effacement (%): Thick Exam by:: Dr Oretha Capricereshsenzo   Prenatal labs: ABO, Rh: --/--/O POS (10/12 0903) Antibody: NEG (10/12 0903) Rubella: 2.58 (04/23 1606) RPR: Non Reactive (07/30 0840)  HBsAg: Negative (04/23 1606)  HIV: Non Reactive (07/30 0840)  GBS: Negative/-- (09/24 0820)  2 hr Glucola: Passed  Genetic screening: Low risk  Anatomy US: Normal   Prenatal Transfer Tool  Maternal Diabetes: No Genetic Screening: Normal  Maternal Ultrasounds/Referrals: Normal Fetal Ultrasounds or other Referrals:  Referred to Materal Fetal Medicine  Maternal Substance Abuse:  No Significant Maternal Medications:  None Significant Maternal Lab Results: Group B Strep negative  Results for orders placed or performed during the hospital encounter of 12/03/18 (from the past 24 hour(s))  Comprehensive metabolic panel   Collection Time: 12/03/18  9:03 AM  Result Value Ref Range   Sodium 137 135 - 145 mmol/L   Potassium 3.6 3.5 - 5.1 mmol/L   Chloride 106 98 - 111 mmol/L   CO2 21 (L) 22 - 32 mmol/L   Glucose, Bld 82 70 - 99 mg/dL   BUN 9 6 - 20 mg/dL   Creatinine, Ser 1.610.48 0.44 - 1.00 mg/dL   Calcium 8.3 (L) 8.9 - 10.3 mg/dL   Total Protein 6.7 6.5 - 8.1 g/dL   Albumin 2.7 (L) 3.5 - 5.0 g/dL   AST 20 15 - 41 U/L   ALT 13 0 - 44 U/L   Alkaline Phosphatase 214 (H) 38 - 126 U/L   Total Bilirubin 0.3 0.3 - 1.2 mg/dL   GFR calc non Af Amer >60 >60 mL/min   GFR calc Af Amer >60 >60 mL/min   Anion gap 10 5 - 15  Urinalysis, Routine w reflex microscopic   Collection Time: 12/03/18  9:14 AM  Result Value Ref Range    Color, Urine YELLOW YELLOW   APPearance CLEAR CLEAR   Specific Gravity, Urine 1.012 1.005 - 1.030   pH 6.0 5.0 - 8.0   Glucose, UA NEGATIVE NEGATIVE mg/dL   Hgb urine dipstick NEGATIVE NEGATIVE   Bilirubin Urine NEGATIVE NEGATIVE   Ketones, ur NEGATIVE  NEGATIVE mg/dL   Protein, ur NEGATIVE NEGATIVE mg/dL   Nitrite NEGATIVE NEGATIVE   Leukocytes,Ua SMALL (A) NEGATIVE   RBC / HPF 0-5 0 - 5 RBC/hpf   WBC, UA 0-5 0 - 5 WBC/hpf   Bacteria, UA NONE SEEN NONE SEEN   Squamous Epithelial / LPF 6-10 0 - 5   Mucus PRESENT   Protein / creatinine ratio, urine   Collection Time: 12/03/18  9:14 AM  Result Value Ref Range   Creatinine, Urine 69.10 mg/dL   Total Protein, Urine 12 mg/dL   Protein Creatinine Ratio 0.17 (H) 0.00 - 0.15 mg/mg[Cre]  Results for orders placed or performed during the hospital encounter of 12/03/18 (from the past 24 hour(s))  CBC   Collection Time: 12/03/18  9:03 AM  Result Value Ref Range   WBC 9.7 4.0 - 10.5 K/uL   RBC 4.25 3.87 - 5.11 MIL/uL   Hemoglobin 10.8 (L) 12.0 - 15.0 g/dL   HCT 34.7 (L) 42.5 - 95.6 %   MCV 78.4 (L) 80.0 - 100.0 fL   MCH 25.4 (L) 26.0 - 34.0 pg   MCHC 32.4 30.0 - 36.0 g/dL   RDW 38.7 56.4 - 33.2 %   Platelets 290 150 - 400 K/uL   nRBC 0.0 0.0 - 0.2 %  Type and screen [facility]   Collection Time: 12/03/18  9:03 AM  Result Value Ref Range   ABO/RH(D) O POS    Antibody Screen NEG    Sample Expiration      12/06/2018,2359 Performed at [facility], [address], [place], [place] [address]     Patient Active Problem List   Diagnosis Date Noted  . Hypertension affecting pregnancy 11/29/2018  . Supervision of normal first pregnancy, antepartum 06/14/2018    Assessment: Brynlee Pennywell is a 23 y.o. G2P0010 at [redacted]w[redacted]d here for induction of labor due to gestational hypertension.  Patient has had headache which may be severe feature.  We are treating and will monitor for improvement.  #Labor: Induction initiated.   Patient has received 1 dose of Cytotec. #Pain: Epidural when requested  #FWB: Category 1, reassuring #ID:  GBS negative #MOF: Breast and bottle #MOC: IUD #Circ:  No  Headache - Patient reports she received 1 dose of Fioricet in the MAU and her headache improved from a 7 to a 6 on the pain scale.  Will give patient another dose of Fioricet and monitor for improvement. -If no improvement consider mag for seizure prophylaxis   Derrel Nip 12/03/2018, 11:11 AM  Midwife attestation: I have seen and examined this patient; I agree with above documentation in the resident's note.   PE: Gen: calm comfortable, NAD Resp: normal effort and rate Abd: gravid; EFW 8.5 lbs by leopolds  ROS, labs, PMH reviewed  Assessment/Plan: Nilani Hugill is a 23 y.o. G2P0010 here for IOL for gHTN Admit to LD Labor: latent FWB: Cat I GBS neg Cytotec for ripening, FB when able MgSO4 if HA not improved Anticipate SVD  Donette Larry, CNM  12/03/2018, 2:24 PM

## 2018-12-03 NOTE — Telephone Encounter (Signed)
Faculty Practice OB/GYN Physician Phone Call Documentation  I received a call from [employer] about elevated BP and symptoms for  [facility], with report of BPs in 120s-130s/90s and headaches, nausea and vomiting.  I called patient, she confirmed her BP and symptoms, reports 7/10 headache. No visual changes but worried about her N/V. She was told to go the [facility] MAU immediately for evaluation and management; will likely be kept for delivery.  MAU staff notified.   Verita Schneiders, MD, [place] for [employer], [place]

## 2018-12-04 ENCOUNTER — Inpatient Hospital Stay (HOSPITAL_COMMUNITY): Payer: Medicaid Other | Admitting: Anesthesiology

## 2018-12-04 DIAGNOSIS — Z3A38 38 weeks gestation of pregnancy: Secondary | ICD-10-CM

## 2018-12-04 DIAGNOSIS — O134 Gestational [pregnancy-induced] hypertension without significant proteinuria, complicating childbirth: Secondary | ICD-10-CM

## 2018-12-04 LAB — CULTURE, OB URINE

## 2018-12-04 LAB — CBC
HCT: 31.8 % — ABNORMAL LOW (ref 36.0–46.0)
HCT: 30.2 % — ABNORMAL LOW (ref 36.0–46.0)
Hemoglobin: 10.8 g/dL — ABNORMAL LOW (ref 12.0–15.0)
Hemoglobin: 10.2 g/dL — ABNORMAL LOW (ref 12.0–15.0)
MCH: 26.5 pg (ref 26.0–34.0)
MCH: 26.4 pg (ref 26.0–34.0)
MCHC: 34 g/dL (ref 30.0–36.0)
MCHC: 33.8 g/dL (ref 30.0–36.0)
MCV: 77.9 fL — ABNORMAL LOW (ref 80.0–100.0)
MCV: 78 fL — ABNORMAL LOW (ref 80.0–100.0)
Platelets: 251 10*3/uL (ref 150–400)
Platelets: 245 10*3/uL (ref 150–400)
RBC: 4.08 MIL/uL (ref 3.87–5.11)
RBC: 3.87 MIL/uL (ref 3.87–5.11)
RDW: 14.6 % (ref 11.5–15.5)
RDW: 14.8 % (ref 11.5–15.5)
WBC: 12.9 10*3/uL — ABNORMAL HIGH (ref 4.0–10.5)
WBC: 11.7 10*3/uL — ABNORMAL HIGH (ref 4.0–10.5)
nRBC: 0 % (ref 0.0–0.2)
nRBC: 0 % (ref 0.0–0.2)

## 2018-12-04 MED ORDER — FENTANYL-BUPIVACAINE-NACL 0.5-0.125-0.9 MG/250ML-% EP SOLN
12.0000 mL/h | EPIDURAL | Status: DC | PRN
  Filled 2018-12-04: qty 250

## 2018-12-04 MED ORDER — SODIUM CHLORIDE (PF) 0.9 % IJ SOLN
INTRAMUSCULAR | Status: DC | PRN
  Administered 2018-12-04: 12 mL/h via EPIDURAL

## 2018-12-04 MED ORDER — PHENYLEPHRINE 40 MCG/ML (10ML) SYRINGE FOR IV PUSH (FOR BLOOD PRESSURE SUPPORT): 80.0000 ug | PREFILLED_SYRINGE | INTRAVENOUS | Status: DC | PRN

## 2018-12-04 MED ORDER — EPHEDRINE 5 MG/ML INJ: 10.0000 mg | INTRAVENOUS | Status: DC | PRN

## 2018-12-04 MED ORDER — TERBUTALINE SULFATE 1 MG/ML IJ SOLN: 0.2500 mg | Freq: Once | INTRAMUSCULAR | Status: DC | PRN

## 2018-12-04 MED ORDER — LACTATED RINGERS IV SOLN
500.0000 mL | Freq: Once | INTRAVENOUS | Status: AC
  Administered 2018-12-04: 500 mL via INTRAVENOUS

## 2018-12-04 MED ORDER — DIPHENHYDRAMINE HCL 50 MG/ML IJ SOLN: 12.5000 mg | INTRAMUSCULAR | Status: DC | PRN

## 2018-12-04 MED ORDER — OXYTOCIN 40 UNITS IN NORMAL SALINE INFUSION - SIMPLE MED: 1.0000 m[IU]/min | INTRAVENOUS | Status: DC

## 2018-12-04 MED ORDER — OXYTOCIN 40 UNITS IN NORMAL SALINE INFUSION - SIMPLE MED
1.0000 m[IU]/min | INTRAVENOUS | Status: DC
  Administered 2018-12-04: 02:00:00 2 m[IU]/min via INTRAVENOUS

## 2018-12-04 MED ORDER — LIDOCAINE HCL (PF) 1 % IJ SOLN
INTRAMUSCULAR | Status: DC | PRN
  Administered 2018-12-04 (×2): 4 mL via EPIDURAL

## 2018-12-04 NOTE — Anesthesia Preprocedure Evaluation (Signed)
Anesthesia Evaluation  Patient identified by MRN, date of birth, ID band Patient awake    Reviewed: Allergy & Precautions, NPO status , Patient's Chart, lab work & pertinent test results  History of Anesthesia Complications Negative for: history of anesthetic complications  Airway Mallampati: II   Neck ROM: Full    Dental   Pulmonary former smoker,    Pulmonary exam normal        Cardiovascular hypertension (PIH), Normal cardiovascular exam     Neuro/Psych PSYCHIATRIC DISORDERS Anxiety negative neurological ROS     GI/Hepatic negative GI ROS, Neg liver ROS,   Endo/Other   Obesity   Renal/GU negative Renal ROS     Musculoskeletal negative musculoskeletal ROS (+)   Abdominal   Peds  Hematology  (+) anemia ,  Plt 251k    Anesthesia Other Findings   Reproductive/Obstetrics (+) Pregnancy                             Anesthesia Physical Anesthesia Plan  ASA: II  Anesthesia Plan: Epidural   Post-op Pain Management:    Induction:   PONV Risk Score and Plan: 2 and Treatment may vary due to age or medical condition  Airway Management Planned: Natural Airway  Additional Equipment: None  Intra-op Plan:   Post-operative Plan:   Informed Consent: I have reviewed the patients History and Physical, chart, labs and discussed the procedure including the risks, benefits and alternatives for the proposed anesthesia with the patient or authorized representative who has indicated his/her understanding and acceptance.       Plan Discussed with: Anesthesiologist  Anesthesia Plan Comments: (Labs reviewed. Platelets acceptable, patient not taking any blood thinning medications. Per RN, FHR tracing reported to be stable enough for sitting procedure. Risks and benefits discussed with patient, including PDPH, backache, epidural hematoma, failed epidural, blood pressure changes, allergic reaction,  and nerve injury. Patient expressed understanding and wished to proceed.)        Anesthesia Quick Evaluation

## 2018-12-04 NOTE — Progress Notes (Signed)
Carla Reyes is a 23 y.o. G2P0010 at [redacted]w[redacted]d admitted for induction of labor due to gestational Hypertension.  Subjective: Feeling some contractions. FB out at 2135.  Objective: BP 120/82   Pulse (!) 106   Temp 98.8 F (37.1 C) (Oral)   Resp 18   Ht 5\' 3"  (1.6 m)   Wt 97.1 kg   LMP 03/07/2018   SpO2 99%   BMI 37.92 kg/m  No intake/output data recorded.  FHT:  FHR: 155 bpm, variability: moderate,  accelerations:  Present,  decelerations:  Absent UC:   irregular, every 3-5 minutes  SVE:   Dilation: 2.5 Effacement (%): 50 Station: -3 Exam by:: Dr. Darene Lamer  Labs: Lab Results  Component Value Date   WBC 9.7 12/03/2018   HGB 10.8 (L) 12/03/2018   HCT 33.3 (L) 12/03/2018   MCV 78.4 (L) 12/03/2018   PLT 290 12/03/2018    Assessment / Plan: 23 yo G1P0 at 35.6 EGA here for IOL 2/2 gHTN.   Labor: Cyto x3, s/p FB. Pitocin started, consider AROM as appropriate. Fetal Wellbeing:  Category I Pain Control:  Epidural and IV pain meds I/D:  Negative Anticipated MOD:  vaginal delivery, CS as appropriate  MOC: Requests post-placental IUD. Reviewed risks/benefits, consent signed and Liletta ordered.  Merilyn Baba DO OB Fellow, Faculty Practice 12/04/2018, 1:35 AM

## 2018-12-04 NOTE — Anesthesia Procedure Notes (Signed)
Epidural Patient location during procedure: OB Start time: 12/04/2018 3:47 PM End time: 12/04/2018 3:51 PM  Staffing Anesthesiologist: Audry Pili, MD Performed: anesthesiologist   Preanesthetic Checklist Completed: patient identified, pre-op evaluation, timeout performed, IV checked, risks and benefits discussed and monitors and equipment checked  Epidural Patient position: sitting Prep: DuraPrep Patient monitoring: continuous pulse ox and blood pressure Approach: midline Location: L2-L3 Injection technique: LOR saline  Needle:  Needle type: Tuohy  Needle gauge: 17 G Needle length: 9 cm Needle insertion depth: 7 cm Catheter size: 19 Gauge Catheter at skin depth: 12 cm Test dose: negative and Other (1% lidocaine)  Assessment Events: blood not aspirated  Additional Notes Patient identified. Risks including, but not limited to, bleeding, infection, nerve damage, paralysis, inadequate analgesia, blood pressure changes, nausea, vomiting, allergic reaction, postpartum back pain, itching, and headache were discussed. Patient expressed understanding and wished to proceed. Sterile prep and drape, including hand hygiene, mask, and sterile gloves were used. The patient was positioned and the spine was prepped. The skin was anesthetized with lidocaine. No paraesthesia or other complication noted. The patient did not experience any signs of intravascular injection such as tinnitus or metallic taste in mouth, nor signs of intrathecal spread such as rapid motor block. Please see nursing notes for vital signs. The patient tolerated the procedure well.   Carla Reyes, MDReason for block:procedure for pain

## 2018-12-04 NOTE — Progress Notes (Signed)
Carla Reyes is a 23 y.o. G2P0010 at [redacted]w[redacted]d admitted for induction of labor due togestational Hypertension.  Subjective: Feeling her contractions a little more, denies LOF or Vaginal Bleeding  Objective: BP (!) 141/90   Pulse 85   Temp 99.2 F (37.3 C) (Oral)   Resp 18   Ht 5\' 3"  (1.6 m)   Wt 97.1 kg   LMP 03/07/2018   SpO2 99%   BMI 37.92 kg/m  No intake/output data recorded.  FHT:  FHR: 135 bpm, variability: moderate,  accelerations:  Present,  decelerations:  Absent UC:   Difficult to pick up, irregular, 1-6 minutes  SVE:   Dilation: 3.5 Effacement (%): 50 Station: -3 Exam by:: Dr. Darene Lamer  Pitocin @ 12 mu/min  Labs: Lab Results  Component Value Date   WBC 9.7 12/03/2018   HGB 10.8 (L) 12/03/2018   HCT 33.3 (L) 12/03/2018   MCV 78.4 (L) 12/03/2018   PLT 290 12/03/2018    Assessment / Plan: 23 yo G1P0 at 7.6 EGA here for IOL 2/2 gHTN.  Labor:Cyto x3, s/p FB. Pitocin started 0135, progressing well, consider AROM as appropriate. Fetal Wellbeing:Category I Pain Control:Epidural and IV pain meds I/D:Negative GBS Anticipated RAX:ENMMHWK delivery, CS as appropriate  Merilyn Baba DO OB Fellow, Faculty Practice 12/04/2018, 5:43 AM

## 2018-12-04 NOTE — Progress Notes (Addendum)
LABOR PROGRESS NOTE  Carla Reyes is a 23 y.o. G2P0010 at [redacted]w[redacted]d  admitted for IOL 2/2 gHTN  Subjective: Patient doing well. Denies any concerns or complaints. Endorses strong contraction and more pelvic pressure.  Objective: BP (!) 150/95   Pulse 100   Temp 98.6 F (37 C) (Oral)   Resp 18   Ht 5\' 3"  (1.6 m)   Wt 97.1 kg   LMP 03/07/2018   SpO2 97%   BMI 37.92 kg/m  or  Vitals:   12/04/18 1930 12/04/18 2001 12/04/18 2030 12/04/18 2101  BP: 129/88 (!) 141/96 132/87 (!) 150/95  Pulse: 78 93 74 100  Resp: 18  20 18   Temp:   98.6 F (37 C)   TempSrc:   Oral   SpO2:      Weight:      Height:       Dilation: 6.5 Effacement (%): 70 Cervical Position: Posterior Station: -1 Presentation: Vertex Exam by:: Dr. Tarry Kos FHT: baseline rate 120, moderate varibility, + acel, early decel Toco: q3 mins  Labs: Lab Results  Component Value Date   WBC 12.9 (H) 12/04/2018   HGB 10.8 (L) 12/04/2018   HCT 31.8 (L) 12/04/2018   MCV 77.9 (L) 12/04/2018   PLT 251 12/04/2018    Patient Active Problem List   Diagnosis Date Noted  . Hypertension affecting pregnancy 11/29/2018  . Supervision of normal first pregnancy, antepartum 06/14/2018    Assessment / Plan: 23 y.o. G2P0010 at [redacted]w[redacted]d here for IOL 2/2 gHTN  Labor: AROM at 2036 with clear fluid. Continue Pitocin.  Fetal Wellbeing:  Cat I Pain Control:  Epidural in place Anticipated MOD:  NSVD  Mina Marble, D.O. [place], PGY2 12/04/2018, 9:14 PM   Attestation:  I confirm that I have verified the information documented in the resident's note and that I have also personally observed exam the physical exam and all medical decision making activities.  The patient was seen by me also Agree with note NST reactive and reassuring UCs as listed Cervical exams as listed in note Seabron Spates, CNM

## 2018-12-05 ENCOUNTER — Inpatient Hospital Stay (HOSPITAL_COMMUNITY): Admission: AD | Admit: 2018-12-05 | Payer: Medicaid Other | Source: Home / Self Care | Admitting: Family Medicine

## 2018-12-05 ENCOUNTER — Encounter (HOSPITAL_COMMUNITY): Payer: Self-pay

## 2018-12-05 ENCOUNTER — Inpatient Hospital Stay (HOSPITAL_COMMUNITY): Payer: Medicaid Other

## 2018-12-05 LAB — PROTEIN / CREATININE RATIO, URINE
Creatinine, Urine: 227.2 mg/dL
Protein, Ur: 86 mg/dL
Protein/Creat Ratio: 379 mg/g creat — ABNORMAL HIGH (ref 0–200)

## 2018-12-05 MED ORDER — ONDANSETRON HCL 4 MG PO TABS: 4.0000 mg | ORAL_TABLET | ORAL | Status: DC | PRN

## 2018-12-05 MED ORDER — COCONUT OIL OIL: 1.0000 "application " | TOPICAL_OIL | Status: DC | PRN

## 2018-12-05 MED ORDER — DIPHENHYDRAMINE HCL 25 MG PO CAPS: 25.0000 mg | ORAL_CAPSULE | Freq: Four times a day (QID) | ORAL | Status: DC | PRN

## 2018-12-05 MED ORDER — SIMETHICONE 80 MG PO CHEW: 80.0000 mg | CHEWABLE_TABLET | ORAL | Status: DC | PRN

## 2018-12-05 MED ORDER — TETANUS-DIPHTH-ACELL PERTUSSIS 5-2.5-18.5 LF-MCG/0.5 IM SUSP: 0.5000 mL | Freq: Once | INTRAMUSCULAR | Status: DC

## 2018-12-05 MED ORDER — WITCH HAZEL-GLYCERIN EX PADS: 1.0000 "application " | MEDICATED_PAD | CUTANEOUS | Status: DC | PRN

## 2018-12-05 MED ORDER — ONDANSETRON HCL 4 MG/2ML IJ SOLN: 4.0000 mg | INTRAMUSCULAR | Status: DC | PRN

## 2018-12-05 MED ORDER — PRENATAL MULTIVITAMIN CH
1.0000 | ORAL_TABLET | Freq: Every day | ORAL | Status: DC
  Administered 2018-12-05 – 2018-12-06 (×2): 1 via ORAL
  Filled 2018-12-05 (×2): qty 1

## 2018-12-05 MED ORDER — BENZOCAINE-MENTHOL 20-0.5 % EX AERO
1.0000 "application " | INHALATION_SPRAY | CUTANEOUS | Status: DC | PRN
  Filled 2018-12-05: qty 56

## 2018-12-05 MED ORDER — DIBUCAINE (PERIANAL) 1 % EX OINT: 1.0000 "application " | TOPICAL_OINTMENT | CUTANEOUS | Status: DC | PRN

## 2018-12-05 MED ORDER — IBUPROFEN 600 MG PO TABS
600.0000 mg | ORAL_TABLET | Freq: Four times a day (QID) | ORAL | Status: DC
  Administered 2018-12-05 – 2018-12-06 (×5): 600 mg via ORAL
  Filled 2018-12-05 (×6): qty 1

## 2018-12-05 MED ORDER — SENNOSIDES-DOCUSATE SODIUM 8.6-50 MG PO TABS
2.0000 | ORAL_TABLET | ORAL | Status: DC
  Administered 2018-12-06: 2 via ORAL
  Filled 2018-12-05: qty 2

## 2018-12-05 MED ORDER — ACETAMINOPHEN 325 MG PO TABS
650.0000 mg | ORAL_TABLET | ORAL | Status: DC | PRN
  Administered 2018-12-05: 09:00:00 650 mg via ORAL
  Filled 2018-12-05: qty 2

## 2018-12-05 NOTE — Progress Notes (Signed)
Social worker made aware of patients edinburg score (16). Consult already ordered and social work will assess.

## 2018-12-05 NOTE — Progress Notes (Signed)
POSTPARTUM PROGRESS NOTE  Post Partum Day 1  Subjective:  Carla Reyes is a 23 y.o. G2P1011 s/p NSVD at [redacted]w[redacted]d.  She reports she is doing well. No acute events overnight. Has not ambulated since delivery. She denies any problems with voiding or po intake. Denies nausea or vomiting.  Pain is well controlled.  Lochia is appropriate.  Objective: Blood pressure 120/79, pulse (!) 105, temperature 98.7 F (37.1 C), temperature source Oral, resp. rate 18, height 5\' 3"  (1.6 m), weight 97.1 kg, last menstrual period 03/07/2018, SpO2 100 %, unknown if currently breastfeeding.  Physical Exam:  General: alert, cooperative and no distress Chest: no respiratory distress Heart:regular rate, distal pulses intact Abdomen: soft, nontender,  Uterine Fundus: firm, appropriately tender DVT Evaluation: No calf swelling or tenderness Extremities: No LE edema Skin: warm, dry  Recent Labs    12/04/18 1223 12/04/18 2311  HGB 10.8* 10.2*  HCT 31.8* 30.2*    Assessment/Plan: Carla Reyes is a 23 y.o. G2P1011 s/p NSVD at [redacted]w[redacted]d   PPD#1 - Doing well  Routine postpartum care  Ambulate, OOB Contraception: IUD at postpartum visit Feeding: Breast Dispo: Plan for discharge likely 10/15   LOS: 2 days   Mina Marble, D.O. [place], PGY2 12/05/2018, 4:20 AM

## 2018-12-05 NOTE — Anesthesia Postprocedure Evaluation (Signed)
Anesthesia Post Note  Patient: Carla Reyes  Procedure(s) Performed: AN AD HOC LABOR EPIDURAL     Patient location during evaluation: Mother Baby Anesthesia Type: Epidural Level of consciousness: awake and alert Pain management: pain level controlled Vital Signs Assessment: post-procedure vital signs reviewed and stable Respiratory status: spontaneous breathing, nonlabored ventilation and respiratory function stable Cardiovascular status: stable Postop Assessment: no headache, no backache, epidural receding, no apparent nausea or vomiting, patient able to bend at knees, adequate PO intake and able to ambulate Anesthetic complications: no    Last Vitals:  Vitals:   12/05/18 0549 12/05/18 0911  BP: 123/87 115/80  Pulse: 90 89  Resp: 19 18  Temp: 36.8 C 37 C  SpO2: 99% 98%    Last Pain:  Vitals:   12/05/18 1139  TempSrc:   PainSc: 2    Pain Goal: Patients Stated Pain Goal: 2 (12/05/18 1139)                 [name]

## 2018-12-05 NOTE — Discharge Summary (Addendum)
Postpartum Discharge Summary    Patient Name: Carla Reyes DOB: 11/11/1995 MRN: [id number]  Date of admission: 12/03/2018 Delivering Provider: Danna Hefty   Date of discharge: 12/06/2018  Admitting diagnosis: High BP Intrauterine pregnancy: [redacted]w[redacted]d    Secondary diagnosis:  Active Problems:   Hypertension affecting pregnancy  Additional problems: none     Discharge diagnosis: Term Pregnancy Delivered and Gestational Hypertension                                                                                                Post partum procedures:none  Augmentation: AROM, Pitocin, Cytotec and Foley Balloon  Complications: None  Hospital course:  Induction of Labor With Vaginal Delivery   23y.o. yo G2P1011 at 355w6das admitted to the hospital 12/03/2018 for induction of labor.  Indication for induction: Gestational hypertension.  Patient had an uncomplicated labor course as follows: Foley was placed and Cytotec used for cervical ripening When Foley came out, Pitocin was started She progressed slowly but steadily throughout the course of 2 days.   AROM was performed when patient was 6-7cm Delivery occurred just under two hours later.  Second stage was only 7 minutes Third stage was only 9 minutes  Membrane Rupture Time/Date: 8:36 PM ,12/04/2018   Intrapartum Procedures: Episiotomy: None [1]                                         Lacerations:  None [1]  Patient had delivery of a Viable infant.  Information for the patient's newborn:  CoJeanifer, Halliday0[[id number]]Delivery Method: Vaginal, Spontaneous(Filed from Delivery Summary)   There was a single nuchal cord, delivered through No difficulty with shoulders  12/04/2018  Details of delivery can be found in separate delivery note.  Patient had a routine postpartum course. Patient is discharged home 12/06/18. Delivery time: 10:40 PM    Magnesium Sulfate received: No BMZ received:  No Rhophylac:N/A MMR:N/A Transfusion:No  Physical exam  Vitals:   12/05/18 0911 12/05/18 1422 12/05/18 2156 12/06/18 0511  BP: 115/80 116/73 118/70 117/78  Pulse: 89 81 82 75  Resp: _0 Temp: 98.6 F (37 C) 98.8 F (37.1 C) 97.9 F (36.6 C) 98.4 F (36.9 C)  TempSrc: Oral Oral  Oral  SpO2: 98% 99% 99% 98%  Weight:      Height:       General: alert, cooperative and no distress Lochia: appropriate Uterine Fundus: firm Incision: N/A DVT Evaluation: No evidence of DVT seen on physical exam. Labs: Lab Results  Component Value Date   WBC 11.7 (H) 12/04/2018   HGB 10.2 (L) 12/04/2018   HCT 30.2 (L) 12/04/2018   MCV 78.0 (L) 12/04/2018   PLT 245 12/04/2018   CMP Latest Ref Rng & Units 12/03/2018  Glucose 70 - 99 mg/dL 82  BUN 6 - 20 mg/dL 9  Creatinine 0.44 - 1.00 mg/dL 0.48  Sodium 135 - 145 mmol/L 137  Potassium 3.5 - 5.1 mmol/L 3.6  Chloride 98 - 111 mmol/L 106  CO2 22 - 32 mmol/L 21(L)  Calcium 8.9 - 10.3 mg/dL 8.3(L)  Total Protein 6.5 - 8.1 g/dL 6.7  Total Bilirubin 0.3 - 1.2 mg/dL 0.3  Alkaline Phos 38 - 126 U/L 214(H)  AST 15 - 41 U/L 20  ALT 0 - 44 U/L 13    Discharge instruction: per After Visit Summary and "Baby and Me Booklet".  After visit meds:  Allergies as of 12/06/2018   No Known Allergies     Medication List    STOP taking these medications   Doxylamine-Pyridoxine 10-10 MG Tbec   Elastic Bandages & Supports Misc     TAKE these medications   cyclobenzaprine 10 MG tablet Commonly known as: FLEXERIL Take 1 tablet (10 mg total) by mouth 2 (two) times daily as needed for muscle spasms.   famotidine 20 MG tablet Commonly known as: PEPCID Take 1 tablet (20 mg total) by mouth 2 (two) times daily.   ibuprofen 600 MG tablet Commonly known as: ADVIL Take 1 tablet (600 mg total) by mouth every 6 (six) hours.   PRENATAL VITAMINS PO Take by mouth.   senna-docusate 8.6-50 MG tablet Commonly known as: Senokot-S Take 2 tablets by  mouth daily. Start taking on: December 07, 2018   Tdap 5-2.5-18.5 LF-MCG/0.5 injection Commonly known as: BOOSTRIX Inject 0.5 mLs into the muscle once for 1 dose.       Diet: routine diet  Activity: Advance as tolerated. Pelvic rest for 6 weeks.   Outpatient follow up:1-2 weeks for BP check then 4 weeks for PP check and IUD placement Follow up Appt:No future appointments. Follow up Visit: [place]              Follow up.   Why: Please call to schedule blood pressure check for 1-2 weeks and postpartum visit for 4-6 weeks Contact information: [place]            Please schedule this patient for Postpartum visit in: 4 weeks with the following provider: Any provider For C/S patients schedule nurse incision check in weeks 2 weeks: no High risk pregnancy complicated by: HTN Delivery mode:  SVD Anticipated Birth Control:  IUD  Wants it placed at [facility] visit PP Procedures needed: BP check  1-2 weeks Schedule Integrated [place] visit: no    Newborn Data: Live born female  Birth Weight: 7 lb 6.7 oz (3365 g) APGAR: 9, 9  Newborn Delivery   Birth date/time: 12/04/2018 22:40:00 Delivery type: Vaginal, Spontaneous      Baby Feeding: Bottle and Breast Disposition:home with mother   12/06/2018 Danna Hefty, DO   Attestation:  I confirm that I have verified the information documented in the resident's note and that I have also personally reperformed the physical exam and all medical decision making activities.  Patient was seen and examined by me also Agree with note Vitals stable Labs stable Fundus firm, lochia within normal limits Perineum healing Ext WNL  Ready for discharge  Seabron Spates, CNM

## 2018-12-05 NOTE — Progress Notes (Signed)
CSW received consult for hx of Anxiety. CSW met with MOB to offer support and complete assessment.    CSW entered the room. CSW congratulated MOB and FOB on the birth of infant Anders Grant). CSW observed that MOB was in bed and FOB was preparing to leave room to get MOB breakfast. CSW waited by computer while FOB handed MOB infant and existed the room. CSW advised MOB of CSW's role and the reason for CSW coming to visit with her. MOB reported that she was diagnosed with anxiety in 2013. MOB reported that she was placed on medications but only took them for a month. MOB reported that she was in therapy while in high school and college but found that this wasn't very helpful for her so she stopped. MOB expressed that since being off medications and out of therapy she has been feeling fine. MOB reported that she has support from her family and FOB at this time. MOB informed CSW that she is not feeling SI or HI and reports that she is not ina domestic violence relationship.   CSW aware that MOB scored 16 on [place]. CSW provided MOB with proper education on PPD and advised MOB to remain aware of her feelings as they relate to PPD. MOB reported that over the last 7 days she had been reading up on PPD and was afraid of labor and just not bonding with infant. MOB reported that she was worried that she wouldn't be attached to infant but reports that she is now attached to infant. MOB reported that she has all essential items needed to care for infant with no further needs at this time.   CSW provided education regarding the baby blues period vs. perinatal mood disorders, discussed treatment and gave resources for mental health follow up if concerns arise.  CSW recommends self-evaluation during the postpartum time period using the New Mom Checklist from Postpartum Progress and encouraged MOB to contact a medical professional if symptoms are noted at any time.   CSW provided review of Sudden Infant Death Syndrome (SIDS)  precautions.   CSW identifies no further need for intervention and no barriers to discharge at this time.   Carla Reyes, MSW, LCSW [facility] [place] [facility] [place] [phone]

## 2018-12-05 NOTE — Progress Notes (Signed)
Post Partum Day 1 Subjective: no complaints, up ad lib, voiding and tolerating PO  Objective: Blood pressure 123/87, pulse 90, temperature 98.3 F (36.8 C), temperature source Oral, resp. rate 19, height 5\' 3"  (1.6 m), weight 97.1 kg, last menstrual period 03/07/2018, SpO2 99 %, unknown if currently breastfeeding.  Physical Exam:  General: alert, cooperative and no distress Lochia: appropriate Uterine Fundus: firm Incision: n/a DVT Evaluation: No evidence of DVT seen on physical exam.  Recent Labs    12/04/18 1223 12/04/18 2311  HGB 10.8* 10.2*  HCT 31.8* 30.2*    Assessment/Plan: Plan for discharge tomorrow and Breastfeeding   LOS: 2 days   Hansel Feinstein 12/05/2018, 5:55 AM

## 2018-12-05 NOTE — Lactation Note (Signed)
This note was copied from a baby's chart. Lactation Consultation Note  Patient Name: Carla Reyes [id number] Date: 12/05/2018 Reason for consult: Initial assessment;Primapara;1st time breastfeeding;Early term 37-38.6wks  LC in to visit with P1 Mom of 38 6/7 week baby at 55 hrs old.  Baby at 1% weight loss.    Mom on her side in bed trying to breastfeed her baby.  Baby dressed and swaddled and rooting.  Talked about STS being important to keep baby stimulated to stay awake.  Unwrapped baby from blankets and adjusted Mom's position and assisted her with latching deeply onto breast.  Basic breastfeeding teaching done while baby was feeding.  Swallows identified for Mom.  Mom's breasts are compressible with erect nipples.  Easy latch for baby.  Encouraged baby being STS as much as possible.  Encouraged Mom to breastfeed baby often with cues.    Mom to ask for assistance prn.  Lactation brochure left in room.  Mom informed of IP and OP lactation support available to her.    Maternal Data Formula Feeding for Exclusion: Yes Reason for exclusion: Mother's choice to formula and breast feed on admission Has patient been taught Hand Expression?: Yes Does the patient have breastfeeding experience prior to this delivery?: No  Feeding Feeding Type: Breast Fed  LATCH Score Latch: Grasps breast easily, tongue down, lips flanged, rhythmical sucking.  Audible Swallowing: Spontaneous and intermittent  Type of Nipple: Everted at rest and after stimulation  Comfort (Breast/Nipple): Soft / non-tender  Hold (Positioning): Assistance needed to correctly position infant at breast and maintain latch.  LATCH Score: 9  Interventions Interventions: Breast feeding basics reviewed;Assisted with latch;Breast massage;Hand express;Breast compression;Adjust position;Support pillows;Position options;Expressed milk    Consult Status Consult Status: Follow-up Date: 12/06/18 Follow-up type:  In-patient    Broadus John 12/05/2018, 2:55 PM

## 2018-12-05 NOTE — Progress Notes (Signed)
CSW went to speak with MOB at beside. CSW aware that RN in room. CSW will return at a later time.      Virgie Dad Marlisa Caridi, MSW, LCSW [facility] [place] [facility] [place] [phone]

## 2018-12-06 MED ORDER — TETANUS-DIPHTH-ACELL PERTUSSIS 5-2.5-18.5 LF-MCG/0.5 IM SUSP: 0.5000 mL | Freq: Once | INTRAMUSCULAR | 0 refills | Status: AC

## 2018-12-06 MED ORDER — SENNOSIDES-DOCUSATE SODIUM 8.6-50 MG PO TABS: 2.0000 | ORAL_TABLET | ORAL | 0 refills | Status: DC

## 2018-12-06 MED ORDER — IBUPROFEN 600 MG PO TABS: 600.0000 mg | ORAL_TABLET | Freq: Four times a day (QID) | ORAL | 0 refills | Status: DC

## 2018-12-06 NOTE — Discharge Instructions (Signed)

## 2018-12-06 NOTE — Lactation Note (Signed)
This note was copied from a baby's chart. Lactation Consultation Note:  P1, infant is 52 hours old . Mother reports that she has tender nipples and feels pain the entire feeding.  Observed pink tissue with tiny cracks in the center of the nipples.   Mother reports using football hold only.  Assist with placing infant on the left breast in cross cradle hold.  Mother taught to hand express and observed large drops of colostrum.  Mother taught to latch infant on in a off sided technique. Infant latched well. Mother taught to do breast compression. Observed steady rhythm of suckling and swallows. Infant sustained latch with audible swallows for 30-35 mins.   Mother was given a harmony hand pump for use at home. She reports that she doesn't have an electric pump and that she is not active with [facility] services.Mother was given comfort gels.   Advised in treatment and prevention of engorgement.   Discussed limiting the use of the pacifier. Advised mother to cue base feed infant and allow for 8-12 or more feedings in 24 hours. Discussed cluster feeding and frequent STS.   Suggested that mother hand express or pump after feedings.  spoon feed infant ebm after feeding to assure infant getting enough.   Mother receptive to teaching. Suggested that she follow up with [facility] services for a feeding assessment. Mother reports that she will call for appt. She has OP number and office number for breastfeeding questions or concerns.   Patient Name: Carla Reyes EPPIR'J Date: 12/06/2018 Reason for consult: Follow-up assessment   Maternal Data    Feeding Feeding Type: Breast Fed  LATCH Score                   Interventions    Lactation Tools Discussed/Used     Consult Status      Darla Lesches 12/06/2018, 9:01 AM

## 2018-12-21 ENCOUNTER — Other Ambulatory Visit: Payer: Self-pay

## 2018-12-21 ENCOUNTER — Ambulatory Visit (INDEPENDENT_AMBULATORY_CARE_PROVIDER_SITE_OTHER): Payer: Medicaid Other

## 2018-12-21 NOTE — Progress Notes (Signed)
Subjective:  Carla Reyes is a 23 y.o. female here for BP check.   Patient presents for BP check due to hypertension during delivery. Patient is not currently taking any blood pressure medications.   Objective:  BP 119/85  P102 Appearance alert, well appearing, and in no distress. General exam BP noted to be well controlled today in office.    Assessment:   Blood Pressure well controlled.   Plan:  Current treatment plan is effective, no change in therapy. Return on 01/03/2019 for postpartum appointment. Kathrene Alu RN

## 2018-12-21 NOTE — Progress Notes (Signed)
Chart reviewed - agree with RN documentation.   

## 2019-01-03 ENCOUNTER — Other Ambulatory Visit: Payer: Self-pay

## 2019-01-03 ENCOUNTER — Ambulatory Visit (INDEPENDENT_AMBULATORY_CARE_PROVIDER_SITE_OTHER): Payer: Medicaid Other | Admitting: Family Medicine

## 2019-01-03 ENCOUNTER — Encounter: Payer: Self-pay | Admitting: Family Medicine

## 2019-01-03 DIAGNOSIS — Z1389 Encounter for screening for other disorder: Secondary | ICD-10-CM | POA: Diagnosis not present

## 2019-01-03 DIAGNOSIS — Z30017 Encounter for initial prescription of implantable subdermal contraceptive: Secondary | ICD-10-CM

## 2019-01-03 MED ORDER — ETONOGESTREL 68 MG ~~LOC~~ IMPL
68.0000 mg | DRUG_IMPLANT | Freq: Once | SUBCUTANEOUS | Status: AC
  Administered 2019-01-03: 68 mg via SUBCUTANEOUS

## 2019-01-03 MED ORDER — CLOTRIMAZOLE 1 % EX CREA: 1.0000 "application " | TOPICAL_CREAM | Freq: Two times a day (BID) | CUTANEOUS | 0 refills | Status: DC

## 2019-01-03 NOTE — Progress Notes (Signed)
Subjective:     Carla Reyes is a 23 y.o. female who presents for a postpartum visit. She is 4 weeks postpartum following a spontaneous vaginal delivery. I have fully reviewed the prenatal and intrapartum course. The delivery was at 73 gestational weeks. Outcome: spontaneous vaginal delivery. Anesthesia: epidural. Postpartum course has been normal. Baby's course has been normal. Baby is feeding by both breast and bottle - Fawn Kirk . Bleeding no bleeding. Bowel function is normal. Bladder function is normal. Patient is sexually active. Contraception method is Nexplanon. Postpartum depression screening: negative.  Still having some nipple pain with breast feeding.  The following portions of the patient's history were reviewed and updated as appropriate: allergies, current medications, past family history, past medical history, past social history, past surgical history and problem list.  Review of Systems Pertinent items are noted in HPI.   Objective:    There were no vitals taken for this visit.  General:  alert, cooperative and no distress  Lungs: clear to auscultation bilaterally  Heart:  regular rate and rhythm, S1, S2 normal, no murmur, click, rub or gallop  Abdomen: soft, non-tender; bowel sounds normal; no masses,  no organomegaly        Nexplanon Insertion:  Patient given informed consent, signed copy in the chart, time out was performed. Pregnancy test was neg. Appropriate time out taken.  Patient's left arm was prepped and draped in the usual sterile fashion.. The ruler used to measure and mark insertion area.  Pt was prepped with alcohol swab and then injected with 3 cc of 1% lidocaine with epinephrine.  Pt was prepped with betadine, Implanon removed form packaging,  Device confirmed in needle, then inserted full length of needle and withdrawn per handbook instructions.  Device palpated by physician and patient.  Pt insertion site covered with pressure dressing.   Minimal  blood loss.  Pt tolerated the procedure well.     Assessment:     normal postpartum exam. Pap smear not done at today's visit.   Plan:    1. Contraception: Nexplanon 2. clotrimazole for yeast infection 3. Follow up in: 3 months or as needed.

## 2019-04-04 ENCOUNTER — Ambulatory Visit (INDEPENDENT_AMBULATORY_CARE_PROVIDER_SITE_OTHER): Payer: Medicaid Other | Admitting: Family Medicine

## 2019-04-04 ENCOUNTER — Other Ambulatory Visit: Payer: Self-pay

## 2019-04-04 ENCOUNTER — Encounter: Payer: Self-pay | Admitting: Family Medicine

## 2019-04-04 VITALS — BP 115/68 | HR 75 | Ht 63.0 in | Wt 182.0 lb

## 2019-04-04 DIAGNOSIS — Z3046 Encounter for surveillance of implantable subdermal contraceptive: Secondary | ICD-10-CM | POA: Diagnosis not present

## 2019-04-04 DIAGNOSIS — Z975 Presence of (intrauterine) contraceptive device: Secondary | ICD-10-CM

## 2019-04-04 NOTE — Progress Notes (Signed)
   Subjective:    Patient ID: Carla Reyes, female    DOB: [birth year], 24 y.o.   MRN: [id number]  HPI Patient seen for follow up of nexplanon, which was inserted about 3 months ago. Not having any problems. No site reaction, not having periods, no cramping. Currently breastfeeding without problems. Does get chills occasionally, lasts for 30 minutes. No fevers, cough, etc.  Last PAP 05/2018   Review of Systems     Objective:   Physical Exam Constitutional:      Appearance: Normal appearance.  Cardiovascular:     Rate and Rhythm: Normal rate.     Pulses: Normal pulses.  Skin:    General: Skin is warm and dry.     Comments: nexplanon palpated in left upper arm. No site reaction  Neurological:     Mental Status: She is alert.       Assessment & Plan:  1. Nexplanon in place Chills would be an uncommon side effect for the nexplanon. No site reaction or infection. Will continue to monitor. Patient to return with any breakthrough bleeding or spotting, cramping, etc.   Return in 1 year for annual exam.

## 2019-08-19 IMAGING — US OBSTETRIC <14 WK ULTRASOUND
1 series · 15 of 26 positions shown · non-contrast
Comparison: None.

CLINICAL DATA: Pelvic pain

EXAM:
OBSTETRIC <14 WK US AND TRANSVAGINAL OB US
TECHNIQUE: Both transabdominal and transvaginal ultrasound examinations were
performed for complete evaluation of the gestation as well as the
maternal uterus, adnexal regions, and pelvic cul-de-sac.
Transvaginal technique was performed to assess early pregnancy.

[Series 1: obstetric <14 wk ultrasound · 15 of 26 slices shown]
[im 1/26]
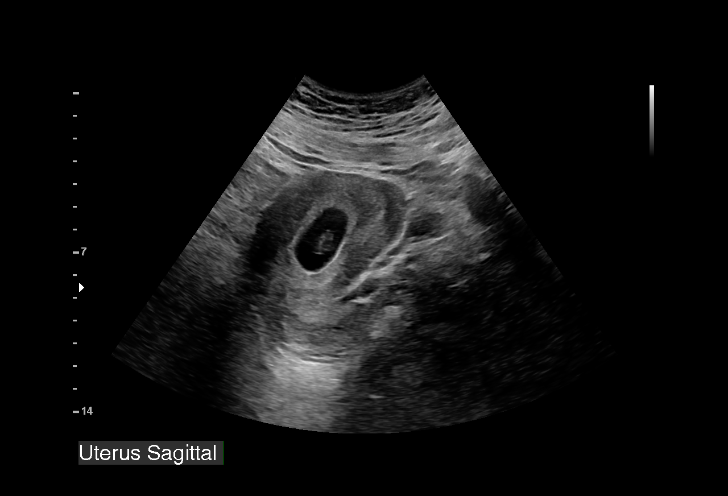
[im 3/26]
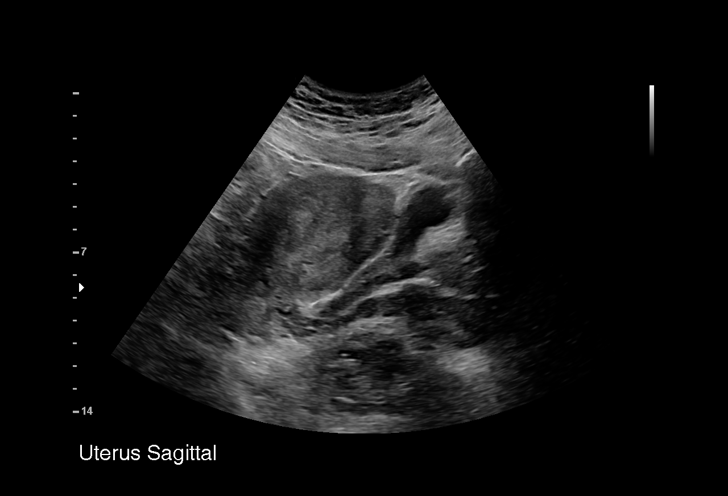
[im 5/26]
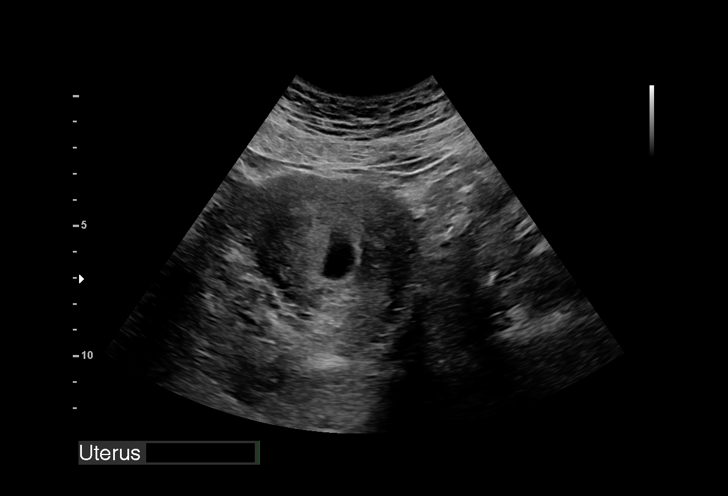
[im 7/26]
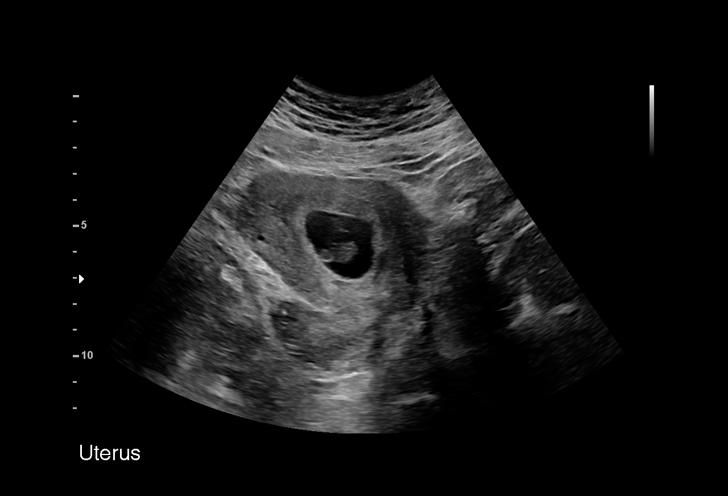
[im 8/26]
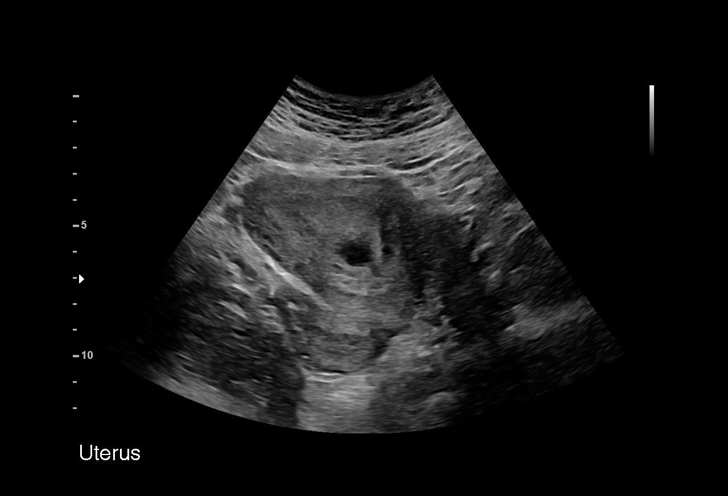
[im 10/26]
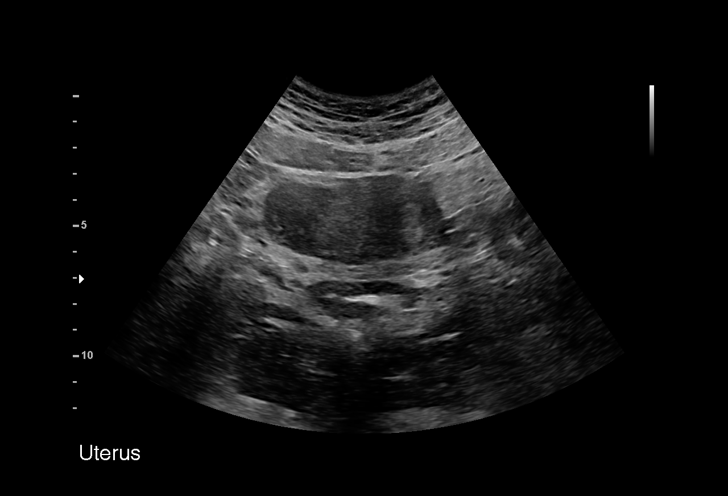
[im 12/26]
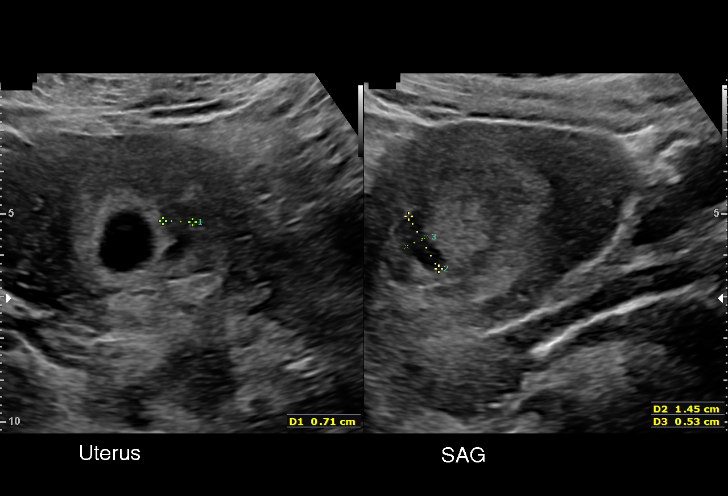
[im 14/26]
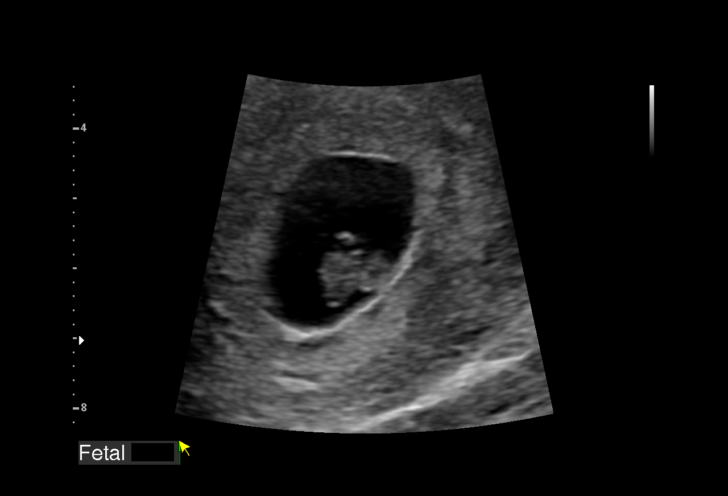
[im 15/26]
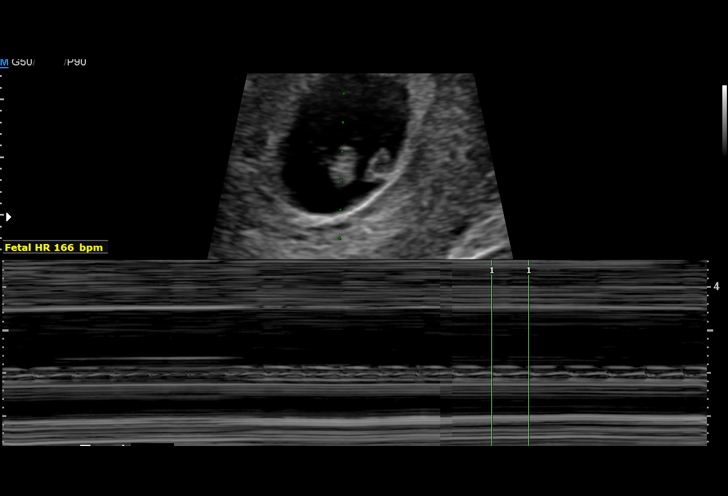
[im 17/26]
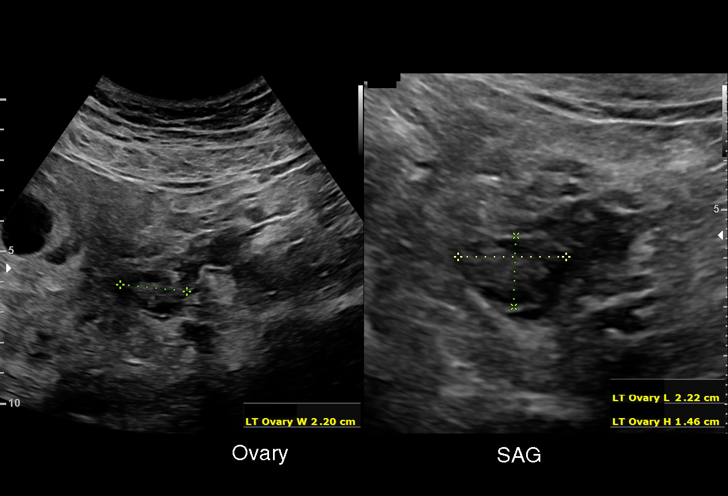
[im 19/26]
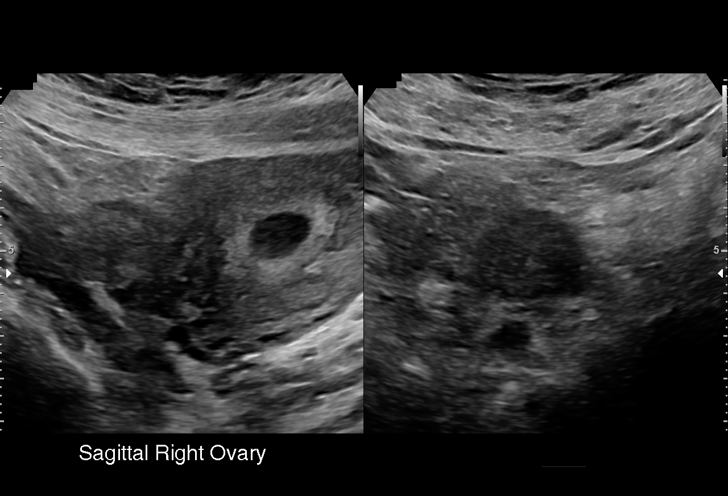
[im 20/26]
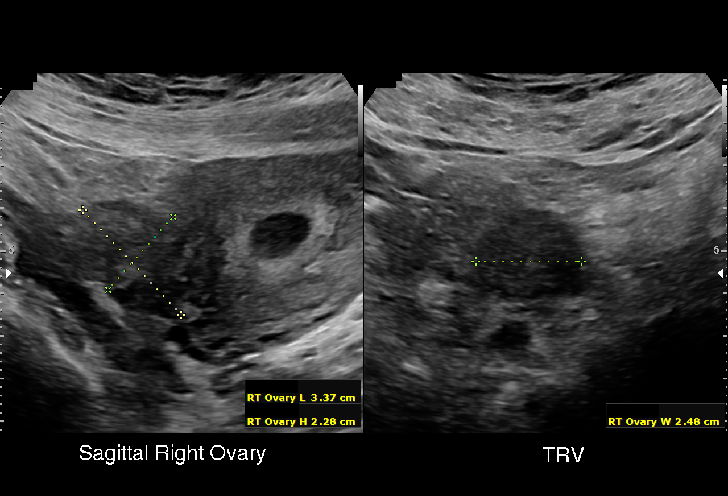
[im 22/26]
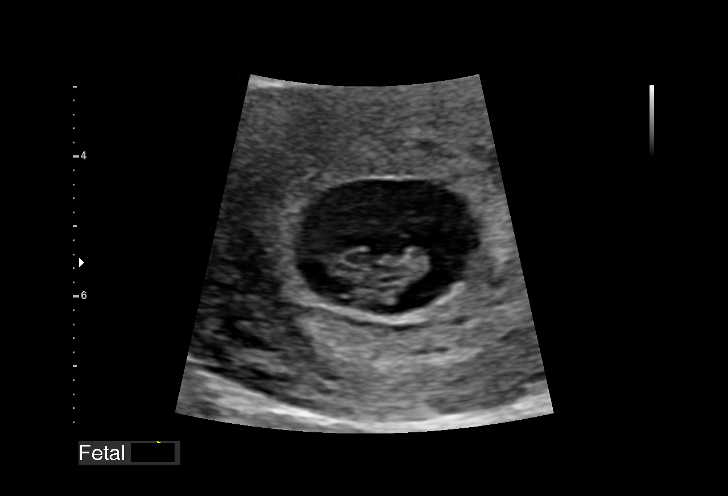
[im 24/26]
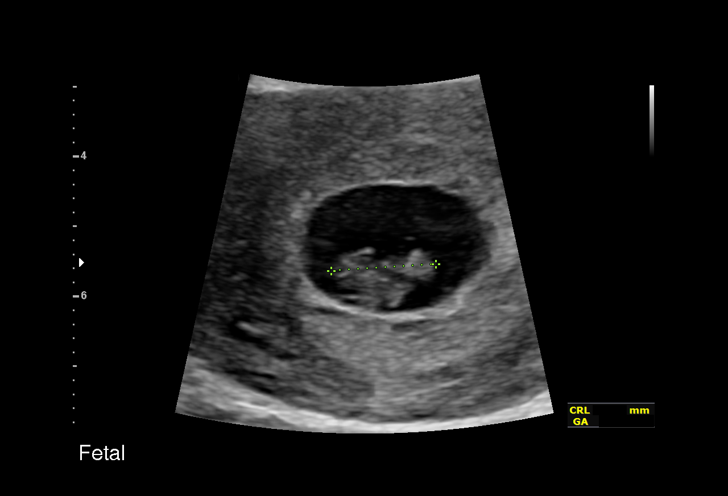
[im 26/26]
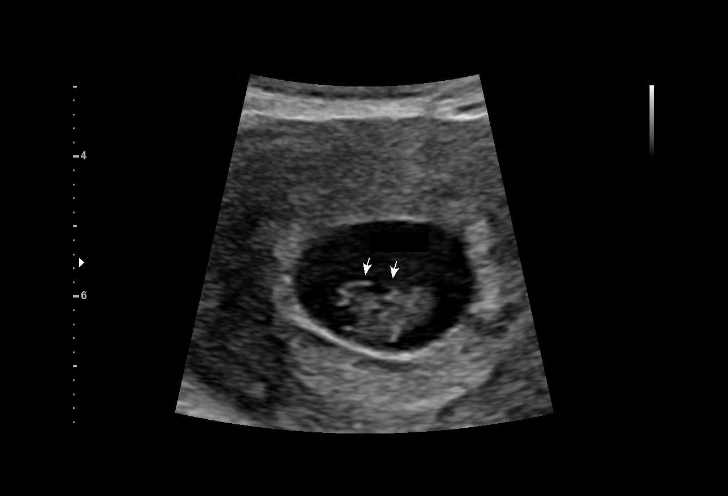

[15 of 26 positions shown; findings below may reference images not displayed]

FINDINGS: Intrauterine gestational sac: Visualized

Yolk sac:  Visualized

Embryo:  Visualized

Cardiac Activity: Visualized

Heart Rate: 166 bpm

CRL:  15 mm   7 w   5 d                  US EDC: December 16, 2018

Subchorionic hemorrhage: There is a subchorionic hemorrhage
measuring 1.5 x 0.5 x 0.7 cm.

Maternal uterus/adnexae: Cervical os is closed. The right ovary
measures 3.4 x 2.3 x 2.5 cm. Left ovary measures 2.2 x 1.5 x 2.2 cm.
Small corpus luteum noted on the right. No other extrauterine pelvic
or adnexal mass. No free pelvic fluid.
IMPRESSION: Single live intrauterine gestation with estimated gestational age of
8-weeks. Subchorionic hemorrhage measuring 1.5 x 0.5 x 0.7 cm. No
extrauterine pelvic mass beyond physiologic corpus luteum on the
right. No free pelvic fluid.

## 2019-09-23 DIAGNOSIS — Z20822 Contact with and (suspected) exposure to covid-19: Secondary | ICD-10-CM | POA: Diagnosis not present

## 2019-11-21 ENCOUNTER — Ambulatory Visit: Payer: Medicaid Other | Admitting: Family Medicine

## 2020-11-19 ENCOUNTER — Other Ambulatory Visit (HOSPITAL_COMMUNITY)
Admission: RE | Admit: 2020-11-19 | Discharge: 2020-11-19 | Disposition: A | Discharge status: Home / Self Care | Payer: Medicaid Other | Source: Ambulatory Visit | Attending: Family Medicine | Admitting: Family Medicine

## 2020-11-19 ENCOUNTER — Ambulatory Visit (INDEPENDENT_AMBULATORY_CARE_PROVIDER_SITE_OTHER): Payer: Medicaid Other | Admitting: Family Medicine

## 2020-11-19 ENCOUNTER — Other Ambulatory Visit: Payer: Self-pay

## 2020-11-19 ENCOUNTER — Encounter: Payer: Self-pay | Admitting: Family Medicine

## 2020-11-19 VITALS — BP 124/81 | HR 75 | Ht 63.0 in | Wt 193.0 lb

## 2020-11-19 DIAGNOSIS — Z01419 Encounter for gynecological examination (general) (routine) without abnormal findings: Secondary | ICD-10-CM | POA: Insufficient documentation

## 2020-11-19 NOTE — Progress Notes (Signed)
Patient had some left breast pain last week that lasted one week. Armandina Stammer RN

## 2020-11-20 LAB — CERVICOVAGINAL ANCILLARY ONLY
Chlamydia: POSITIVE — AB
Comment: NEGATIVE
Comment: NORMAL
Neisseria Gonorrhea: NEGATIVE

## 2020-11-20 LAB — HEPATITIS B SURFACE ANTIGEN: Hepatitis B Surface Ag: NEGATIVE

## 2020-11-20 LAB — RPR: RPR Ser Ql: NONREACTIVE

## 2020-11-20 LAB — HEPATITIS C ANTIBODY: Hep C Virus Ab: <0.1 s/co ratio (ref 0.0–0.9)

## 2020-11-20 LAB — HIV ANTIBODY (ROUTINE TESTING W REFLEX): HIV Screen 4th Generation wRfx: NONREACTIVE

## 2020-11-20 NOTE — Progress Notes (Signed)
GYNECOLOGY ANNUAL PREVENTATIVE CARE ENCOUNTER NOTE  Subjective:   Carla Reyes is a 25 y.o. G53P1011 female here for a routine annual gynecologic exam.  Current complaints: left breast pain x1 week. Swollen, sensitive to touch - even light touch was uncomfortable. Wearing bra was helpful. No erythema to breast. Micah Flesher away after a week and is now normal.   Denies abnormal vaginal bleeding, discharge, pelvic pain, problems with intercourse or other gynecologic concerns.    Gynecologic History No LMP recorded. Patient has had an implant. Patient is sexually active  Contraception: Nexplanon Last Pap: 2020. Results were: normal Last mammogram: n/a. Colorectal Cancer Screening: n/a.  Obstetric History OB History  Gravida Para Term Preterm AB Living  2 1 1   1 1   SAB IAB Ectopic Multiple Live Births  1     0 1    # Outcome Date GA Lbr Len/2nd Weight Sex Delivery Anes PTL Lv  2 Term 12/04/18 [redacted]w[redacted]d / 00:07 7 lb 6.7 oz (3.365 kg) M Vag-Spont EPI  LIV  1 SAB             Past Medical History:  Diagnosis Date   Anxiety    HPV in female 2019   Pregnancy induced hypertension    Vaginal Pap smear, abnormal     Past Surgical History:  Procedure Laterality Date   NO PAST SURGERIES      Current Outpatient Medications on File Prior to Visit  Medication Sig Dispense Refill   etonogestrel (NEXPLANON) 68 MG IMPL implant 1 each by Subdermal route once.     clotrimazole (LOTRIMIN) 1 % cream Apply 1 application topically 2 (two) times daily. (Patient not taking: Reported on 04/04/2019) 30 g 0   cyclobenzaprine (FLEXERIL) 10 MG tablet Take 1 tablet (10 mg total) by mouth 2 (two) times daily as needed for muscle spasms. 30 tablet 0   famotidine (PEPCID) 20 MG tablet Take 1 tablet (20 mg total) by mouth 2 (two) times daily. (Patient not taking: No sig reported) 60 tablet 3   ibuprofen (ADVIL) 600 MG tablet Take 1 tablet (600 mg total) by mouth every 6 (six) hours. (Patient not taking: Reported on  12/21/2018) 30 tablet 0   Prenatal Vit-Fe Fumarate-FA (PRENATAL VITAMINS PO) Take by mouth.     senna-docusate (SENOKOT-S) 8.6-50 MG tablet Take 2 tablets by mouth daily. (Patient not taking: No sig reported) 30 tablet 0   No current facility-administered medications on file prior to visit.    Allergies  Allergen Reactions   Senokot Wheat Bran [Wheat Bran] Hives and Itching    Social History   Socioeconomic History   Marital status: Single    Spouse name: Not on file   Number of children: Not on file   Years of education: Not on file   Highest education level: Not on file  Occupational History   Not on file  Tobacco Use   Smoking status: Former    Types: Cigarettes    Quit date: 2019    Years since quitting: 3.7   Smokeless tobacco: Never  Vaping Use   Vaping Use: Former   Quit date: 04/07/2018  Substance and Sexual Activity   Alcohol use: Not Currently   Drug use: Not Currently   Sexual activity: Yes    Birth control/protection: Implant  Other Topics Concern   Not on file  Social History Narrative   Not on file   Social Determinants of Health   Financial Resource Strain: Not on  file  Food Insecurity: Not on file  Transportation Needs: Not on file  Physical Activity: Not on file  Stress: Not on file  Social Connections: Not on file  Intimate Partner Violence: Not on file    Family History  Problem Relation Age of Onset   Hypertension Father    Diabetes Maternal Grandfather    Diabetes Paternal Grandmother    Diabetes Paternal Grandfather    Arthritis Paternal Grandfather     The following portions of the patient's history were reviewed and updated as appropriate: allergies, current medications, past family history, past medical history, past social history, past surgical history and problem list.  Review of Systems Pertinent items are noted in HPI.   Objective:  BP 124/81   Pulse 75   Ht 5\' 3"  (1.6 m)   Wt 193 lb (87.5 kg)   BMI 34.19 kg/m  Wt  Readings from Last 3 Encounters:  11/19/20 193 lb (87.5 kg)  04/04/19 182 lb (82.6 kg)  01/03/19 192 lb (87.1 kg)     Chaperone present during exam  CONSTITUTIONAL: Well-developed, well-nourished female in no acute distress.  HENT:  Normocephalic, atraumatic, External right and left ear normal. Oropharynx is clear and moist EYES: Conjunctivae and EOM are normal. Pupils are equal, round, and reactive to light. No scleral icterus.  NECK: Normal range of motion, supple, no masses.  Normal thyroid.   CARDIOVASCULAR: Normal heart rate noted, regular rhythm RESPIRATORY: Clear to auscultation bilaterally. Effort and breath sounds normal, no problems with respiration noted. BREASTS: Symmetric in size. No masses, skin changes, nipple drainage, or lymphadenopathy. ABDOMEN: Soft, normal bowel sounds, no distention noted.  No tenderness, rebound or guarding.  PELVIC: Normal appearing external genitalia; normal appearing vaginal mucosa and cervix.  No abnormal discharge noted.  Normal uterine size, no other palpable masses, no uterine or adnexal tenderness. MUSCULOSKELETAL: Normal range of motion. No tenderness.  No cyanosis, clubbing, or edema.  2+ distal pulses. SKIN: Skin is warm and dry. No rash noted. Not diaphoretic. No erythema. No pallor. NEUROLOGIC: Alert and oriented to person, place, and time. Normal reflexes, muscle tone coordination. No cranial nerve deficit noted. PSYCHIATRIC: Normal mood and affect. Normal behavior. Normal judgment and thought content.  Assessment:  Annual gynecologic examination with pap smear   Plan:  1. Well Woman Exam Will follow up results of pap smear and manage accordingly. Breast appears normal. Patient to call if returns and will work in for urgent appointment. STD testing discussed. Patient requested testing  - Cervicovaginal ancillary only( [place]) - HIV antibody (with reflex) - RPR - Hepatitis C Antibody - Hepatitis B Surface  AntiGEN   Routine preventative health maintenance measures emphasized. Please refer to After Visit Summary for other counseling recommendations.    13/12/20, DO [facility]

## 2020-11-23 ENCOUNTER — Telehealth: Payer: Self-pay

## 2020-11-23 DIAGNOSIS — A749 Chlamydial infection, unspecified: Secondary | ICD-10-CM

## 2020-11-23 MED ORDER — DOXYCYCLINE HYCLATE 100 MG PO CAPS: 100.0000 mg | ORAL_CAPSULE | Freq: Two times a day (BID) | ORAL | 0 refills | Status: DC

## 2020-11-23 NOTE — Telephone Encounter (Signed)
Pt called requesting results. Pt made aware that she tested positive for Chlamydia. Pt advised [facility] and to not have sex for 7-10 days after she and partner have been treated. Pt is scheduled for TOC in 3 weeks. STD form was faxed to [facility]. Understanding was voiced. Shalunda Lindh l Petrina Melby, CMA

## 2020-12-14 ENCOUNTER — Other Ambulatory Visit: Payer: Self-pay

## 2020-12-14 ENCOUNTER — Other Ambulatory Visit (HOSPITAL_COMMUNITY)
Admission: RE | Admit: 2020-12-14 | Discharge: 2020-12-14 | Disposition: A | Discharge status: Home / Self Care | Payer: Medicaid Other | Source: Ambulatory Visit | Attending: Obstetrics & Gynecology | Admitting: Obstetrics & Gynecology

## 2020-12-14 ENCOUNTER — Ambulatory Visit (INDEPENDENT_AMBULATORY_CARE_PROVIDER_SITE_OTHER): Payer: Medicaid Other

## 2020-12-14 VITALS — BP 118/73 | HR 70 | Wt 191.0 lb

## 2020-12-14 DIAGNOSIS — Z8619 Personal history of other infectious and parasitic diseases: Secondary | ICD-10-CM

## 2020-12-14 DIAGNOSIS — N898 Other specified noninflammatory disorders of vagina: Secondary | ICD-10-CM

## 2020-12-14 NOTE — Addendum Note (Signed)
Addended by: Lorelle Gibbs L on: 12/14/2020 04:09 PM   Modules accepted: Orders

## 2020-12-14 NOTE — Progress Notes (Addendum)
Pt presents for TOC for Chlamydia. Self swab was sent to the lab. Joyanne Eddinger l Mackayla Mullins, CMA   Attestation of Attending Supervision of CMA/RN: Evaluation and management procedures were performed by the nurse under my supervision and collaboration.  I have reviewed the nursing note and chart, and I agree with the management and plan.  Carolyn L. Harraway-Smith, M.D., [facility]

## 2020-12-16 LAB — CERVICOVAGINAL ANCILLARY ONLY
Chlamydia: NEGATIVE
Comment: NEGATIVE
Comment: NORMAL
Neisseria Gonorrhea: NEGATIVE

## 2021-05-28 IMAGING — US US MFM OB FOLLOW-UP
1 series · 14 of 28 positions shown · non-contrast
Comparison: none

[Series 1: us mfm ob follow-up · 32 acquisitions, 14 frames shown]
[im 2/32]
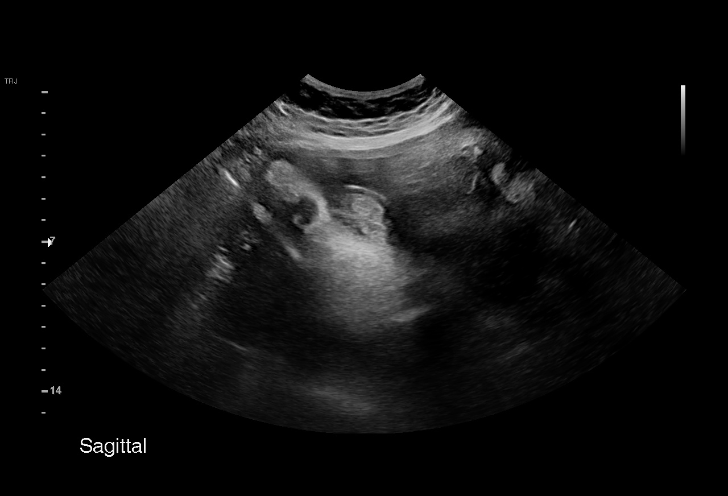
[im 4/32]
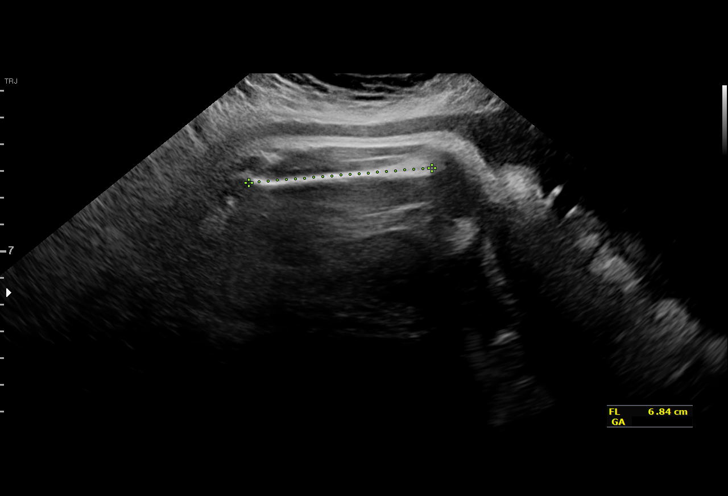
[im 6/32]
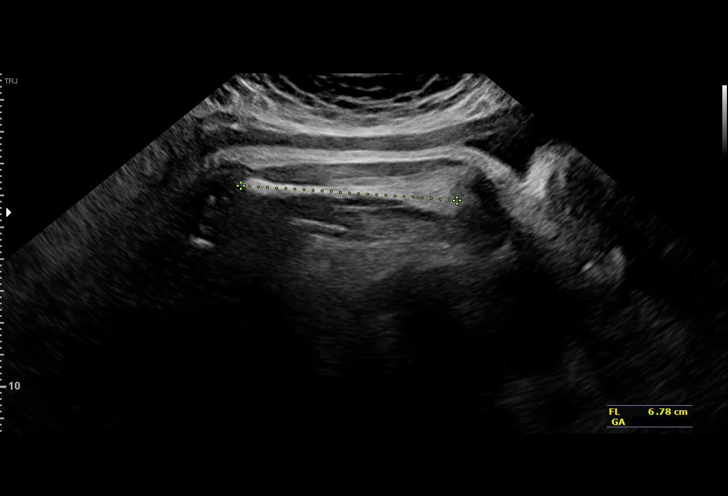
[im 9/32]
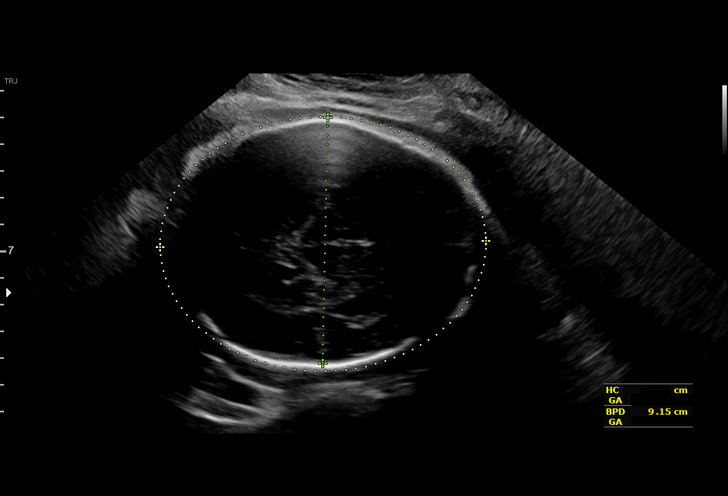
[im 11/32]
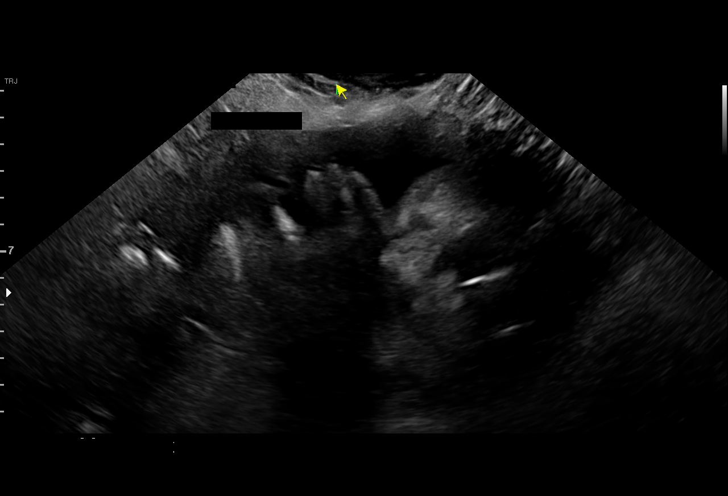
[im 13/32]
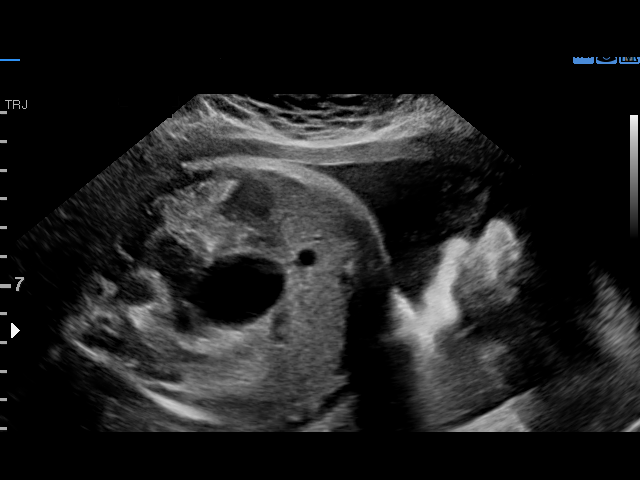
[im 15/32]
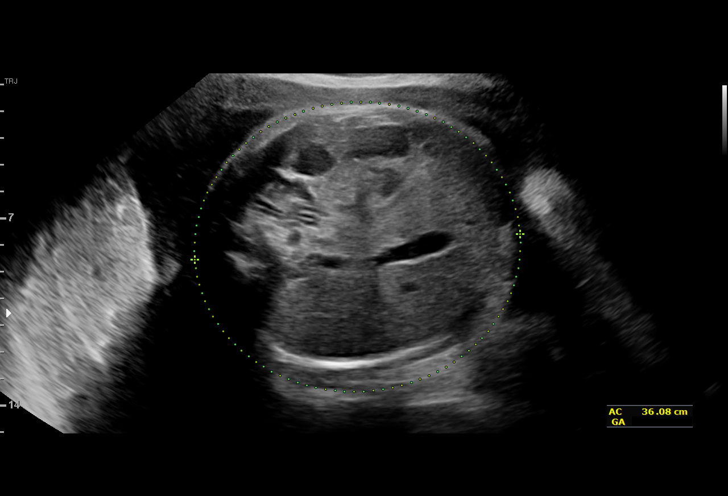
[im 18/32]
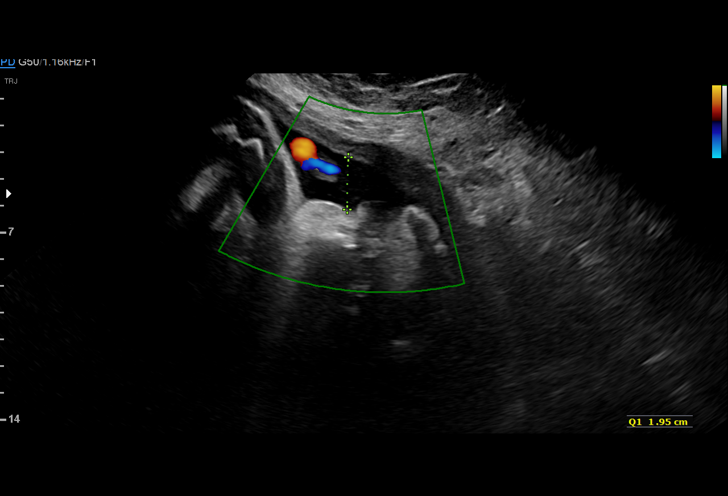
[im 20/32]
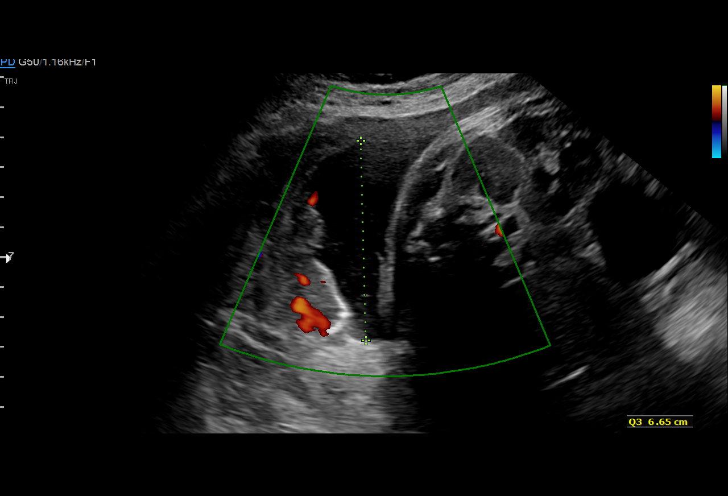
[im 22/32]
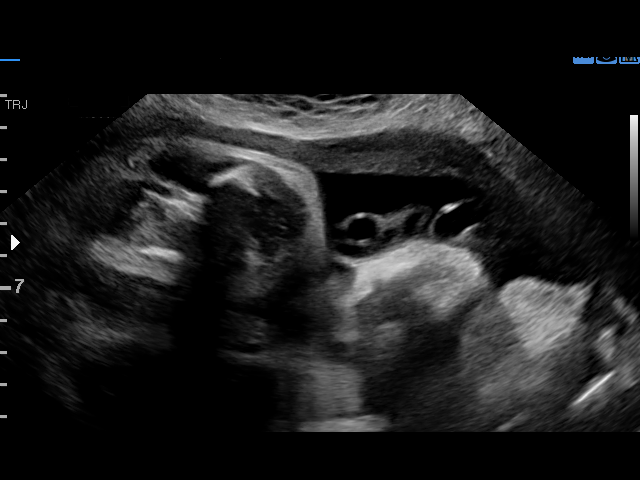
[im 25/32]
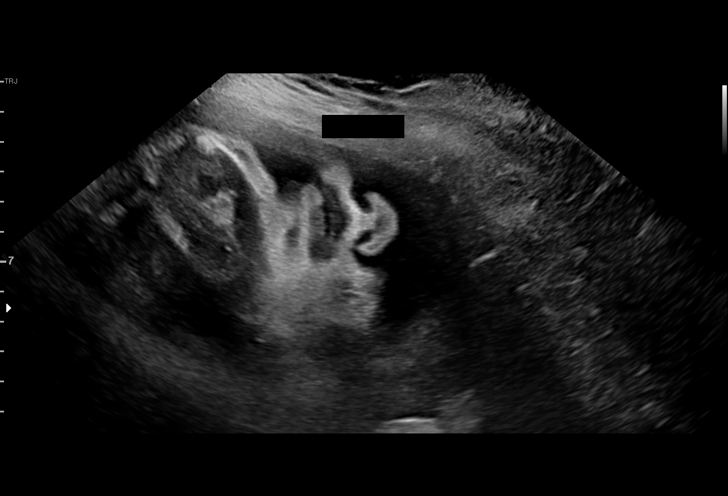
[im 27/32]
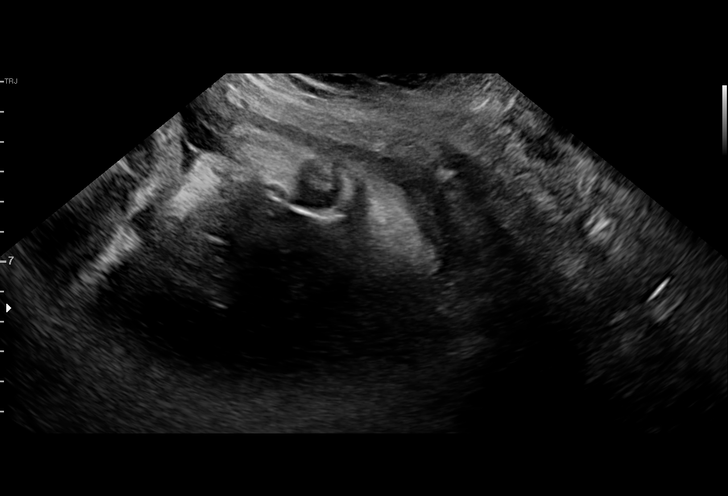
[im 29/32]
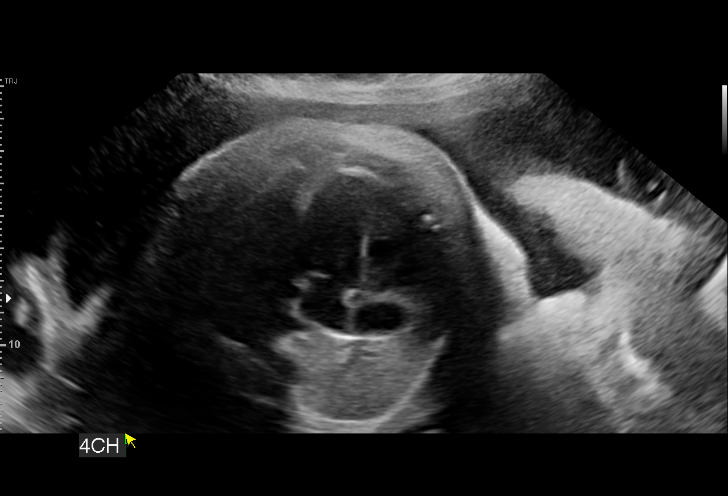
[im 32/32]
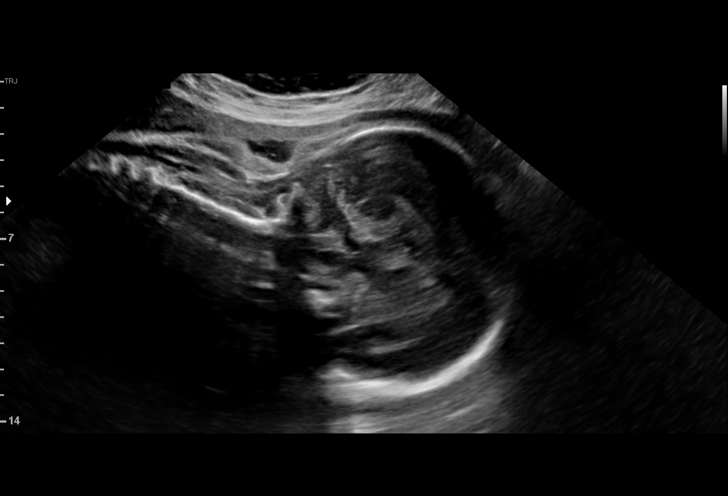

[14 of 28 positions shown; findings below may reference images not displayed]

----------------------------------------------------------------------

 ----------------------------------------------------------------------
Indications

  Unspecified maternal hypertension, third
  trimester
  Large for gestational age fetus affecting
  management of mother
  Obesity complicating pregnancy, third
  trimester (pregravid BMI 32)
  38 weeks gestation of pregnancy
  Low risk Panorama
 ----------------------------------------------------------------------
Vital Signs

                                                Height:        5'3"
Fetal Evaluation

 Num Of Fetuses:         1
 Fetal Heart Rate(bpm):  121
 Cardiac Activity:       Observed
 Presentation:           Cephalic
 Placenta:               Posterior

 Amniotic Fluid
 AFI FV:      Within normal limits

 AFI Sum(cm)     %Tile       Largest Pocket(cm)
 16.8            66

 RUQ(cm)       RLQ(cm)       LUQ(cm)        LLQ(cm)

Biophysical Evaluation
 Amniotic F.V:   Pocket => 2 cm             F. Tone:        Observed
 F. Movement:    Observed                   Score:          [DATE]
 F. Breathing:   Observed
Biometry

 BPD:      90.9  mm     G. Age:  36w 6d         39  %    CI:        70.41   %    70 - 86
                                                         FL/HC:      19.7   %    20.9 -
 HC:      345.4  mm     G. Age:  40w 0d         74  %    HC/AC:      0.99        0.92 -
 AC:      349.9  mm     G. Age:  38w 6d         85  %    FL/BPD:     74.7   %    71 - 87
 FL:       67.9  mm     G. Age:  34w 6d          2  %    FL/AC:      19.4   %    20 - 24

 Est. FW:    1110  gm      7 lb 6 oz     56  %
OB History

 Gravidity:    2         Term:   0        Prem:   0        SAB:   1
 TOP:          0       Ectopic:  0        Living: 0
Gestational Age

 LMP:           38w 1d        Date:  03/07/18                 EDD:   12/12/18
 U/S Today:     37w 5d                                        EDD:   12/15/18
 Best:          38w 1d     Det. By:  LMP  (03/07/18)          EDD:   12/12/18
Anatomy

 Cranium:               Appears normal         Stomach:                Appears normal, left
                                                                       sided
 Lips:                  Appears normal         Kidneys:                Appear normal
 Heart:                 Appears normal         Bladder:                Appears normal
                        (4CH, axis, and
                        situs)
Impression

 ?Transient/Gestational hypertension.
 Fetal growth is appropriate for gestational age. Amniotic fluid
 is normal and good fetal activity is seen. Antenatal testing is
 reassuring. BPP [DATE].
 Patient will be undergoing induction of labor on 12/05/2018.
                 Mone, Umer

## 2021-09-09 ENCOUNTER — Encounter: Payer: Self-pay | Admitting: General Practice

## 2021-12-14 ENCOUNTER — Ambulatory Visit: Payer: Self-pay | Admitting: Advanced Practice Midwife

## 2022-02-03 ENCOUNTER — Ambulatory Visit: Payer: Self-pay | Admitting: Family Medicine

## 2022-02-17 ENCOUNTER — Encounter: Payer: Self-pay | Admitting: General Practice

## 2022-02-17 ENCOUNTER — Ambulatory Visit: Payer: Self-pay | Admitting: Family Medicine
